# Patient Record
Sex: Male | Born: 1975 | Race: White | Hispanic: No | Marital: Single
Health system: Midwestern US, Community
[De-identification: ages and names within clinical notes are randomized; demographics above are authoritative.]

## PROBLEM LIST (undated history)

## (undated) DIAGNOSIS — R569 Unspecified convulsions: Secondary | ICD-10-CM

---

## 2018-05-30 ENCOUNTER — Other Ambulatory Visit: Payer: Self-pay

## 2018-05-30 ENCOUNTER — Emergency Department (HOSPITAL_BASED_OUTPATIENT_CLINIC_OR_DEPARTMENT_OTHER): Payer: BLUE CROSS/BLUE SHIELD

## 2018-05-30 ENCOUNTER — Ambulatory Visit: Payer: Self-pay | Admitting: Cardiology

## 2018-05-30 ENCOUNTER — Inpatient Hospital Stay (HOSPITAL_BASED_OUTPATIENT_CLINIC_OR_DEPARTMENT_OTHER)
Admission: EM | Admit: 2018-05-30 | Discharge: 2018-06-01 | DRG: 189 | Discharge status: Against Medical Advice | Payer: BLUE CROSS/BLUE SHIELD | Attending: Family Medicine | Admitting: Family Medicine

## 2018-05-30 ENCOUNTER — Encounter (HOSPITAL_BASED_OUTPATIENT_CLINIC_OR_DEPARTMENT_OTHER): Payer: Self-pay | Admitting: *Deleted

## 2018-05-30 DIAGNOSIS — R569 Unspecified convulsions: Secondary | ICD-10-CM | POA: Diagnosis present

## 2018-05-30 DIAGNOSIS — J9601 Acute respiratory failure with hypoxia: Secondary | ICD-10-CM | POA: Diagnosis not present

## 2018-05-30 DIAGNOSIS — G4733 Obstructive sleep apnea (adult) (pediatric): Secondary | ICD-10-CM | POA: Diagnosis present

## 2018-05-30 DIAGNOSIS — R55 Syncope and collapse: Secondary | ICD-10-CM | POA: Diagnosis not present

## 2018-05-30 DIAGNOSIS — F17210 Nicotine dependence, cigarettes, uncomplicated: Secondary | ICD-10-CM | POA: Diagnosis present

## 2018-05-30 DIAGNOSIS — J9602 Acute respiratory failure with hypercapnia: Secondary | ICD-10-CM | POA: Diagnosis present

## 2018-05-30 DIAGNOSIS — R4182 Altered mental status, unspecified: Secondary | ICD-10-CM

## 2018-05-30 DIAGNOSIS — R404 Transient alteration of awareness: Secondary | ICD-10-CM

## 2018-05-30 DIAGNOSIS — K6289 Other specified diseases of anus and rectum: Secondary | ICD-10-CM

## 2018-05-30 DIAGNOSIS — R4020 Unspecified coma: Secondary | ICD-10-CM | POA: Diagnosis present

## 2018-05-30 DIAGNOSIS — N492 Inflammatory disorders of scrotum: Secondary | ICD-10-CM | POA: Diagnosis present

## 2018-05-30 HISTORY — DX: Unspecified convulsions: R56.9

## 2018-05-30 LAB — CBC WITH DIFFERENTIAL/PLATELET
ABS IMMATURE GRANULOCYTES: 0.11 10*3/uL — AB (ref 0.00–0.07)
Basophils Absolute: 0.1 10*3/uL (ref 0.0–0.1)
Basophils Relative: 0 %
EOS ABS: 0 10*3/uL (ref 0.0–0.5)
Eosinophils Relative: 0 %
HEMATOCRIT: 45.1 % (ref 39.0–52.0)
Hemoglobin: 14.3 g/dL (ref 13.0–17.0)
IMMATURE GRANULOCYTES: 1 %
LYMPHS ABS: 1.9 10*3/uL (ref 0.7–4.0)
Lymphocytes Relative: 10 %
MCH: 29.7 pg (ref 26.0–34.0)
MCHC: 31.7 g/dL (ref 30.0–36.0)
MCV: 93.6 fL (ref 80.0–100.0)
Monocytes Absolute: 2 10*3/uL — ABNORMAL HIGH (ref 0.1–1.0)
Monocytes Relative: 10 %
NEUTROS ABS: 16.3 10*3/uL — AB (ref 1.7–7.7)
NEUTROS PCT: 79 %
NRBC: 0 % (ref 0.0–0.2)
PLATELETS: 294 10*3/uL (ref 150–400)
RBC: 4.82 MIL/uL (ref 4.22–5.81)
RDW: 13.5 % (ref 11.5–15.5)
WBC: 20.4 10*3/uL — ABNORMAL HIGH (ref 4.0–10.5)

## 2018-05-30 LAB — BASIC METABOLIC PANEL
ANION GAP: 7 (ref 5–15)
BUN: 15 mg/dL (ref 6–20)
CALCIUM: 8.8 mg/dL — AB (ref 8.9–10.3)
CHLORIDE: 103 mmol/L (ref 98–111)
CO2: 24 mmol/L (ref 22–32)
Creatinine, Ser: 1.01 mg/dL (ref 0.61–1.24)
GFR calc Af Amer: >60 mL/min (ref >60)
GFR calc non Af Amer: >60 mL/min (ref >60)
GLUCOSE: 93 mg/dL (ref 70–99)
Potassium: 3.9 mmol/L (ref 3.5–5.1)
Sodium: 134 mmol/L — ABNORMAL LOW (ref 135–145)

## 2018-05-30 LAB — CBC
HEMATOCRIT: 44.4 % (ref 39.0–52.0)
Hemoglobin: 14.5 g/dL (ref 13.0–17.0)
MCH: 30.5 pg (ref 26.0–34.0)
MCHC: 32.7 g/dL (ref 30.0–36.0)
MCV: 93.3 fL (ref 80.0–100.0)
PLATELETS: 276 10*3/uL (ref 150–400)
RBC: 4.76 MIL/uL (ref 4.22–5.81)
RDW: 13.3 % (ref 11.5–15.5)
WBC: 20.6 10*3/uL — ABNORMAL HIGH (ref 4.0–10.5)
nRBC: 0 % (ref 0.0–0.2)

## 2018-05-30 LAB — URINALYSIS, ROUTINE W REFLEX MICROSCOPIC
Bilirubin Urine: NEGATIVE
Glucose, UA: >=500 mg/dL — AB
Hgb urine dipstick: NEGATIVE
KETONES UR: NEGATIVE mg/dL
LEUKOCYTES UA: NEGATIVE
NITRITE: NEGATIVE
PROTEIN: NEGATIVE mg/dL
Specific Gravity, Urine: >1.03 — ABNORMAL HIGH (ref 1.005–1.030)
pH: 6 (ref 5.0–8.0)

## 2018-05-30 LAB — RAPID URINE DRUG SCREEN, HOSP PERFORMED
Amphetamines: NOT DETECTED
BARBITURATES: NOT DETECTED
BENZODIAZEPINES: NOT DETECTED
COCAINE: NOT DETECTED
OPIATES: NOT DETECTED
Tetrahydrocannabinol: POSITIVE — AB

## 2018-05-30 LAB — I-STAT VENOUS BLOOD GAS, ED
BICARBONATE: 27.6 mmol/L (ref 20.0–28.0)
O2 Saturation: 76 %
PH VEN: 7.316 (ref 7.250–7.430)
TCO2: 29 mmol/L (ref 22–32)
pCO2, Ven: 53.7 mmHg (ref 44.0–60.0)
pO2, Ven: 44 mmHg (ref 32.0–45.0)

## 2018-05-30 LAB — D-DIMER, QUANTITATIVE: D-Dimer, Quant: <0.27 ug/mL-FEU (ref 0.00–0.50)

## 2018-05-30 LAB — URINALYSIS, MICROSCOPIC (REFLEX): WBC UA: NONE SEEN WBC/hpf (ref 0–5)

## 2018-05-30 LAB — CBG MONITORING, ED: GLUCOSE-CAPILLARY: 92 mg/dL (ref 70–99)

## 2018-05-30 LAB — I-STAT CG4 LACTIC ACID, ED: LACTIC ACID, VENOUS: 0.91 mmol/L (ref 0.5–1.9)

## 2018-05-30 LAB — ETHANOL: Alcohol, Ethyl (B): <10 mg/dL (ref <10)

## 2018-05-30 MED ORDER — ONDANSETRON HCL 4 MG/2ML IJ SOLN
INTRAMUSCULAR | Status: AC
  Filled 2018-05-30: qty 2

## 2018-05-30 MED ORDER — HALOPERIDOL LACTATE 5 MG/ML IJ SOLN
5.0000 mg | Freq: Once | INTRAMUSCULAR | Status: AC
  Administered 2018-05-30: 5 mg via INTRAVENOUS
  Filled 2018-05-30: qty 1

## 2018-05-30 MED ORDER — SODIUM CHLORIDE 0.9 % IV BOLUS
1000.0000 mL | Freq: Once | INTRAVENOUS | Status: AC
  Administered 2018-05-30: 1000 mL via INTRAVENOUS

## 2018-05-30 MED ORDER — CLINDAMYCIN PHOSPHATE 600 MG/50ML IV SOLN
600.0000 mg | Freq: Once | INTRAVENOUS | Status: AC
  Administered 2018-05-30: 600 mg via INTRAVENOUS
  Filled 2018-05-30: qty 50

## 2018-05-30 MED ORDER — NICOTINE 21 MG/24HR TD PT24
21.0000 mg | MEDICATED_PATCH | Freq: Once | TRANSDERMAL | Status: DC
  Administered 2018-05-30: 21 mg via TRANSDERMAL
  Filled 2018-05-30: qty 1

## 2018-05-30 MED ORDER — ONDANSETRON HCL 4 MG/2ML IJ SOLN
4.0000 mg | Freq: Once | INTRAMUSCULAR | Status: AC
  Administered 2018-05-30: 4 mg via INTRAVENOUS

## 2018-05-30 NOTE — ED Notes (Signed)
Pt taken to radiology

## 2018-05-30 NOTE — ED Triage Notes (Signed)
Family member reports pt was found unresponsive at the [facility] on [address]. EMS evaluation on scene. POV transport to ED by family. Pt states he remembers pulling up to the gas pump and walking into the store. Pt has a history of seizures and has had hx of irregular heartbeat which he has an appt to see cardiology. Pt's wife states he was in his car for an unknown length of time before EMS was called. Pt states he last had a seizure in 2009. C/o difficulty hearing. Pt unclear of events. Alert at this time. Pt's wife reports he has vomited at least 2-3 times. Unknown if pt fell. CBG 385 by EMS pta

## 2018-05-30 NOTE — Progress Notes (Signed)
Patient's SPCO 0.9 on Pulse CO - Oximeter.

## 2018-05-30 NOTE — Progress Notes (Signed)
Patient's SPCO 0.9 on Pulse CO - Oximeter.  Dr Clarene DukeLittle was in the room and is aware of result.

## 2018-05-30 NOTE — ED Triage Notes (Signed)
Pt in BR with family member at this time

## 2018-05-30 NOTE — ED Notes (Signed)
US at bedside

## 2018-05-30 NOTE — ED Notes (Signed)
Per Pts wife.  Pt driving home from [place] today. States that he stopped at a gas station, went inside to pay(unsure if he really went in).  States that he sat in his car 'unresponsive' for 1-1.5 hours before clerk called 911.  Pt unresponsive on EMS arrival.  CBG 358 by EMS. Pt refused transport to hospital.  States that he has had multiple episode of syncope over the last few months. Wore a halter monitor and was supposed to get the results today but was unable to make it to his appointment. Pt reports 'irregular' HR.  Also reports drinking 'multiple Monster' energy drinks daily.  Pt denies ETOH and drug abuse.  Pt very lethargic on assessment.  Shallow breathing. Reports that he has an abscess in his groin that is draining and has been there for 'months'

## 2018-05-30 NOTE — ED Notes (Signed)
Pt getting dressed, attempting to leave.  EDP aware.  At bedside to talk with patient.  Pt provided with a caffeine free coke.  No acute distress noted.

## 2018-05-30 NOTE — ED Notes (Signed)
Pt unable to give a urine sample at this time.

## 2018-05-30 NOTE — Progress Notes (Signed)
Placed patient on end tidal CO2 monitoring and 4 liters of of O2 due to brief periods of apnea and O2 desaturation.  Notifed Dr Clarene DukeLittle and she is aware.  RT will continue to monitor.

## 2018-05-31 ENCOUNTER — Inpatient Hospital Stay (HOSPITAL_COMMUNITY): Payer: BLUE CROSS/BLUE SHIELD

## 2018-05-31 ENCOUNTER — Encounter (HOSPITAL_COMMUNITY): Payer: Self-pay | Admitting: *Deleted

## 2018-05-31 DIAGNOSIS — J9602 Acute respiratory failure with hypercapnia: Secondary | ICD-10-CM

## 2018-05-31 DIAGNOSIS — N492 Inflammatory disorders of scrotum: Secondary | ICD-10-CM

## 2018-05-31 DIAGNOSIS — R55 Syncope and collapse: Secondary | ICD-10-CM

## 2018-05-31 DIAGNOSIS — R569 Unspecified convulsions: Secondary | ICD-10-CM | POA: Diagnosis present

## 2018-05-31 DIAGNOSIS — J9601 Acute respiratory failure with hypoxia: Principal | ICD-10-CM

## 2018-05-31 DIAGNOSIS — R4182 Altered mental status, unspecified: Secondary | ICD-10-CM

## 2018-05-31 DIAGNOSIS — F17210 Nicotine dependence, cigarettes, uncomplicated: Secondary | ICD-10-CM | POA: Diagnosis present

## 2018-05-31 DIAGNOSIS — G4733 Obstructive sleep apnea (adult) (pediatric): Secondary | ICD-10-CM | POA: Diagnosis present

## 2018-05-31 DIAGNOSIS — R4020 Unspecified coma: Secondary | ICD-10-CM | POA: Diagnosis present

## 2018-05-31 LAB — I-STAT VENOUS BLOOD GAS, ED
Acid-Base Excess: 3 mmol/L — ABNORMAL HIGH (ref 0.0–2.0)
Bicarbonate: 31 mmol/L — ABNORMAL HIGH (ref 20.0–28.0)
O2 SAT: 93 %
PCO2 VEN: 63.8 mmHg — AB (ref 44.0–60.0)
PH VEN: 7.295 (ref 7.250–7.430)
TCO2: 33 mmol/L — ABNORMAL HIGH (ref 22–32)
pO2, Ven: 78 mmHg — ABNORMAL HIGH (ref 32.0–45.0)

## 2018-05-31 LAB — TSH: TSH: 3.306 u[IU]/mL (ref 0.350–4.500)

## 2018-05-31 LAB — BLOOD GAS, ARTERIAL
Acid-Base Excess: 2.9 mmol/L — ABNORMAL HIGH (ref 0.0–2.0)
Bicarbonate: 27.8 mmol/L (ref 20.0–28.0)
Drawn by: 313941
FIO2: 21
O2 Saturation: 94.2 %
PH ART: 7.366 (ref 7.350–7.450)
Patient temperature: 98.3
pCO2 arterial: 49.7 mmHg — ABNORMAL HIGH (ref 32.0–48.0)
pO2, Arterial: 69.9 mmHg — ABNORMAL LOW (ref 83.0–108.0)

## 2018-05-31 LAB — CBC
HCT: 40.1 % (ref 39.0–52.0)
Hemoglobin: 12.6 g/dL — ABNORMAL LOW (ref 13.0–17.0)
MCH: 29.4 pg (ref 26.0–34.0)
MCHC: 31.4 g/dL (ref 30.0–36.0)
MCV: 93.5 fL (ref 80.0–100.0)
Platelets: 239 10*3/uL (ref 150–400)
RBC: 4.29 MIL/uL (ref 4.22–5.81)
RDW: 13.2 % (ref 11.5–15.5)
WBC: 9.9 10*3/uL (ref 4.0–10.5)
nRBC: 0 % (ref 0.0–0.2)

## 2018-05-31 LAB — HEPATIC FUNCTION PANEL
ALT: 20 U/L (ref 0–44)
AST: 17 U/L (ref 15–41)
Albumin: 3.6 g/dL (ref 3.5–5.0)
Alkaline Phosphatase: 53 U/L (ref 38–126)
Bilirubin, Direct: <0.1 mg/dL (ref 0.0–0.2)
Total Bilirubin: 0.6 mg/dL (ref 0.3–1.2)
Total Protein: 6 g/dL — ABNORMAL LOW (ref 6.5–8.1)

## 2018-05-31 LAB — CBC WITH DIFFERENTIAL/PLATELET
Abs Immature Granulocytes: 0.04 10*3/uL (ref 0.00–0.07)
Basophils Absolute: 0.1 10*3/uL (ref 0.0–0.1)
Basophils Relative: 1 %
Eosinophils Absolute: 0.2 10*3/uL (ref 0.0–0.5)
Eosinophils Relative: 2 %
HCT: 38.8 % — ABNORMAL LOW (ref 39.0–52.0)
Hemoglobin: 12.6 g/dL — ABNORMAL LOW (ref 13.0–17.0)
Immature Granulocytes: 0 %
Lymphocytes Relative: 32 %
Lymphs Abs: 3.4 10*3/uL (ref 0.7–4.0)
MCH: 30.2 pg (ref 26.0–34.0)
MCHC: 32.5 g/dL (ref 30.0–36.0)
MCV: 93 fL (ref 80.0–100.0)
Monocytes Absolute: 1.3 10*3/uL — ABNORMAL HIGH (ref 0.1–1.0)
Monocytes Relative: 12 %
NEUTROS PCT: 53 %
NRBC: 0 % (ref 0.0–0.2)
Neutro Abs: 5.8 10*3/uL (ref 1.7–7.7)
Platelets: 218 10*3/uL (ref 150–400)
RBC: 4.17 MIL/uL — AB (ref 4.22–5.81)
RDW: 13.3 % (ref 11.5–15.5)
WBC: 10.8 10*3/uL — ABNORMAL HIGH (ref 4.0–10.5)

## 2018-05-31 LAB — BASIC METABOLIC PANEL
Anion gap: 7 (ref 5–15)
BUN: 10 mg/dL (ref 6–20)
CO2: 28 mmol/L (ref 22–32)
Calcium: 8.4 mg/dL — ABNORMAL LOW (ref 8.9–10.3)
Chloride: 102 mmol/L (ref 98–111)
Creatinine, Ser: 0.99 mg/dL (ref 0.61–1.24)
GFR calc Af Amer: >60 mL/min (ref >60)
GFR calc non Af Amer: >60 mL/min (ref >60)
Glucose, Bld: 75 mg/dL (ref 70–99)
Potassium: 3.8 mmol/L (ref 3.5–5.1)
Sodium: 137 mmol/L (ref 135–145)

## 2018-05-31 LAB — CK: Total CK: 90 U/L (ref 49–397)

## 2018-05-31 LAB — MAGNESIUM: Magnesium: 1.9 mg/dL (ref 1.7–2.4)

## 2018-05-31 LAB — TROPONIN I
Troponin I: 0.04 ng/mL (ref <0.03)
Troponin I: <0.03 ng/mL (ref <0.03)
Troponin I: <0.03 ng/mL (ref <0.03)

## 2018-05-31 LAB — MRSA PCR SCREENING: MRSA BY PCR: NEGATIVE

## 2018-05-31 LAB — SALICYLATE LEVEL: Salicylate Lvl: <7 mg/dL (ref 2.8–30.0)

## 2018-05-31 MED ORDER — PIPERACILLIN-TAZOBACTAM 3.375 G IVPB
3.3750 g | Freq: Three times a day (TID) | INTRAVENOUS | Status: DC
  Filled 2018-05-31 (×2): qty 50

## 2018-05-31 MED ORDER — ENOXAPARIN SODIUM 40 MG/0.4ML ~~LOC~~ SOLN: 40.0000 mg | SUBCUTANEOUS | Status: DC

## 2018-05-31 MED ORDER — IOHEXOL 300 MG/ML  SOLN
100.0000 mL | Freq: Once | INTRAMUSCULAR | Status: AC | PRN
  Administered 2018-05-31: 100 mL via INTRAVENOUS

## 2018-05-31 MED ORDER — CLINDAMYCIN HCL 300 MG PO CAPS
300.0000 mg | ORAL_CAPSULE | Freq: Three times a day (TID) | ORAL | Status: DC
  Administered 2018-05-31 – 2018-06-01 (×3): 300 mg via ORAL
  Filled 2018-05-31 (×4): qty 1

## 2018-05-31 MED ORDER — NICOTINE 21 MG/24HR TD PT24
21.0000 mg | MEDICATED_PATCH | Freq: Once | TRANSDERMAL | Status: AC
  Administered 2018-05-31: 21 mg via TRANSDERMAL
  Filled 2018-05-31: qty 1

## 2018-05-31 MED ORDER — NICOTINE 21 MG/24HR TD PT24
21.0000 mg | MEDICATED_PATCH | Freq: Every day | TRANSDERMAL | Status: DC
  Filled 2018-05-31: qty 1

## 2018-05-31 MED ORDER — ACETAMINOPHEN 325 MG PO TABS
650.0000 mg | ORAL_TABLET | Freq: Four times a day (QID) | ORAL | Status: DC | PRN
  Administered 2018-05-31 (×2): 650 mg via ORAL
  Filled 2018-05-31 (×2): qty 2

## 2018-05-31 MED ORDER — SODIUM CHLORIDE 0.9 % IV SOLN
INTRAVENOUS | Status: AC
  Administered 2018-05-31: 18:00:00 via INTRAVENOUS

## 2018-05-31 MED ORDER — ACETAMINOPHEN 650 MG RE SUPP: 650.0000 mg | Freq: Four times a day (QID) | RECTAL | Status: DC | PRN

## 2018-05-31 MED ORDER — ONDANSETRON HCL 4 MG/2ML IJ SOLN: 4.0000 mg | Freq: Four times a day (QID) | INTRAMUSCULAR | Status: DC | PRN

## 2018-05-31 MED ORDER — ONDANSETRON HCL 4 MG PO TABS: 4.0000 mg | ORAL_TABLET | Freq: Four times a day (QID) | ORAL | Status: DC | PRN

## 2018-05-31 NOTE — Progress Notes (Signed)
EEG completed; results pending.    

## 2018-05-31 NOTE — ED Notes (Signed)
Lawson FiscalLori: [phone]: [phone]

## 2018-05-31 NOTE — H&P (Signed)
History and Physical    Ricardo Blair ZOX:[id number] DOB: 30-Mar-1976 DOA: 05/30/2018  PCP: Patient, No Pcp Per  Patient coming from: Home.  Chief Complaint: Loss of consciousness.  HPI: Ricardo Blair is a 42 y.o. male with previous remote history of seizures was found to be unresponsive in his car at the gas station and was brought to the ER admits in [facility]  Patient states he had gone to the gas station last evening and the next thing he remembers that the EMS was trying to wake him up.  Denies any headache chest pain shortness of breath nausea vomiting abdominal pain diarrhea.  Admits to smoking marijuana off and on.  Last 1 was many weeks ago.  Patient states of recently he has been having frequent palpitations and has been scheduled to follow-up with cardiologist in [facility] which he missed his appointment last month.  Also has been having scrotal swelling around the perineal area has been left for last few months.  Has not sought any medical attention.  Denies any discharge from the penis.  ED Course: In the ER labs show leukocytosis EKG shows normal sinus rhythm with QRS of 96 ms and acceptable QTc interval.  D-dimer was negative.  CT head is unremarkable.  ER physician discussed with the urologist about the small scrotal abscess seen in the sonogram of the scrotum with urologist who advised to keep patient on antibiotics and reconsult them if required and also discussed the neurologist about the patient's unresponsive episode with previous history of seizures and neurologist recommended MRI brain and EEG.  While in the ER patient was drowsy and hypoxic for which patient had VBG done which showed mild hypercarbia and was placed on BiPAP.  On my exam patient is mildly drowsy but follows commands.  Urine drug screen is positive for marijuana.  Patient states he does take salicylates every day.  Review of Systems: As per HPI, rest all negative.   Past Medical History:  Diagnosis Date  .  Seizures (HCC)     History reviewed. No pertinent surgical history.   reports that he has been smoking cigarettes. He has quit using smokeless tobacco. He reports that he drinks alcohol. He reports that he has current or past drug history. Drug: Marijuana.  No Known Allergies  Family History  Family history unknown: Yes    Prior to Admission medications   Not on File    Physical Exam: Vitals:   05/31/18 0038 05/31/18 0200 05/31/18 0342 05/31/18 0400  BP:  133/69 126/71   Pulse:  69 80 65  Resp: 10 11 11  (!) 6  Temp:   98.3 F (36.8 C)   TempSrc:   Oral   SpO2: 100% 100% 100% 100%  Weight:   80.4 kg   Height:   6\' 3"  (1.905 m)       Constitutional: Moderately built and nourished. Vitals:   05/31/18 0038 05/31/18 0200 05/31/18 0342 05/31/18 0400  BP:  133/69 126/71   Pulse:  69 80 65  Resp: 10 11 11  (!) 6  Temp:   98.3 F (36.8 C)   TempSrc:   Oral   SpO2: 100% 100% 100% 100%  Weight:   80.4 kg   Height:   6\' 3"  (1.905 m)    Eyes: Anicteric no pallor. ENMT: No discharge from the ears eyes nose or mouth. Neck: No mass felt.  No neck rigidity. Respiratory: No rhonchi or crepitations. Cardiovascular: S1-S2 heard no murmurs appreciated. Abdomen:  Soft nontender bowel sounds present. Musculoskeletal: No edema.  No joint effusion. Skin: No rash. Neurologic: Alert awake mildly drowsy oriented to time place and person moves all extremities.  Pupils equal and reacting to light. Psychiatric: Denies any suicidal ideation.   Labs on Admission: I have personally reviewed following labs and imaging studies  CBC: Recent Labs  Lab 05/30/18 1909  WBC 20.4*  20.6*  NEUTROABS 16.3*  HGB 14.3  14.5  HCT 45.1  44.4  MCV 93.6  93.3  PLT 294  276   Basic Metabolic Panel: Recent Labs  Lab 05/30/18 1909  NA 134*  K 3.9  CL 103  CO2 24  GLUCOSE 93  BUN 15  CREATININE 1.01  CALCIUM 8.8*   GFR: Estimated Creatinine Clearance: 109.5 mL/min (by C-G formula  based on SCr of 1.01 mg/dL). Liver Function Tests: No results for input(s): AST, ALT, ALKPHOS, BILITOT, PROT, ALBUMIN in the last 168 hours. No results for input(s): LIPASE, AMYLASE in the last 168 hours. No results for input(s): AMMONIA in the last 168 hours. Coagulation Profile: No results for input(s): INR, PROTIME in the last 168 hours. Cardiac Enzymes: No results for input(s): CKTOTAL, CKMB, CKMBINDEX, TROPONINI in the last 168 hours. BNP (last 3 results) No results for input(s): PROBNP in the last 8760 hours. HbA1C: No results for input(s): HGBA1C in the last 72 hours. CBG: Recent Labs  Lab 05/30/18 1855  GLUCAP 92   Lipid Profile: No results for input(s): CHOL, HDL, LDLCALC, TRIG, CHOLHDL, LDLDIRECT in the last 72 hours. Thyroid Function Tests: No results for input(s): TSH, T4TOTAL, FREET4, T3FREE, THYROIDAB in the last 72 hours. Anemia Panel: No results for input(s): VITAMINB12, FOLATE, FERRITIN, TIBC, IRON, RETICCTPCT in the last 72 hours. Urine analysis:    Component Value Date/Time   COLORURINE YELLOW 05/30/2018 1910   APPEARANCEUR CLEAR 05/30/2018 1910   LABSPEC >1.030 (H) 05/30/2018 1910   PHURINE 6.0 05/30/2018 1910   GLUCOSEU >=500 (A) 05/30/2018 1910   HGBUR NEGATIVE 05/30/2018 1910   BILIRUBINUR NEGATIVE 05/30/2018 1910   KETONESUR NEGATIVE 05/30/2018 1910   PROTEINUR NEGATIVE 05/30/2018 1910   NITRITE NEGATIVE 05/30/2018 1910   LEUKOCYTESUR NEGATIVE 05/30/2018 1910   Sepsis Labs: @LABRCNTIP (procalcitonin:4,lacticidven:4) )No results found for this or any previous visit (from the past 240 hour(s)).   Radiological Exams on Admission: Dg Chest 2 View  Result Date: 05/30/2018 CLINICAL DATA:  Leukocytosis, altered level of consciousness, found unresponsive earlier today, history of seizures EXAM: CHEST - 2 VIEW COMPARISON:  None FINDINGS: Normal heart size, mediastinal contours, and pulmonary vascularity. Lungs clear. No pulmonary infiltrate, pleural  effusion, or pneumothorax. Question LEFT nipple shadow. No acute osseous findings. IMPRESSION: No acute abnormalities. Question LEFT nipple shadow; repeat PA chest radiograph with nipple markers recommended to exclude pulmonary nodule. Electronically Signed   By: Ulyses SouthwardMark  Boles M.D.   On: 05/30/2018 20:04   Ct Head Wo Contrast  Result Date: 05/30/2018 CLINICAL DATA:  Altered level of consciousness, history of seizures EXAM: CT HEAD WITHOUT CONTRAST TECHNIQUE: Contiguous axial images were obtained from the base of the skull through the vertex without intravenous contrast. COMPARISON:  None FINDINGS: Brain: Normal ventricular morphology. No midline shift or mass effect. Normal appearance of brain parenchyma. No intracranial hemorrhage, mass lesion, or evidence of acute infarction. No extra-axial fluid collections. Vascular: No hyperdense vessels. Skull: Intact Sinuses/Orbits: Mucosal thickening throughout ethmoid air cells bilaterally. Remaining visualized paranasal sinuses and mastoid air cells clear Other: N/A IMPRESSION: No acute intracranial abnormalities. Electronically Signed  By: Ulyses Southward M.D.   On: 05/30/2018 20:01   US Pelvis Limited (transabdominal Only)  Result Date: 05/30/2018 CLINICAL DATA:  Mass on perineum that drains fluid EXAM: LIMITED ULTRASOUND OF PELVIS TECHNIQUE: Limited transabdominal ultrasound examination of the pelvis was performed. COMPARISON:  None. FINDINGS: Targeted ultrasound in the region of concern. Echogenic edema within the soft tissues with soft tissue thickening present. Small complex collection with thickened vascular rim, this measures 1.5 x 0.5 x 1.6 cm. IMPRESSION: 1.5 x 0.5 x 1.6 cm thick-walled collection within the perineum corresponding to the region of concern. This could reflect abscess or inflammatory fluid collection. There is edema and soft tissue thickening of the surrounding soft tissues. Electronically Signed   By: Jasmine Pang M.D.   On: 05/30/2018 21:28    Dg Chest Port 1 View  Result Date: 05/30/2018 CLINICAL DATA:  Altered level of consciousness. Repeat exam with nipple marker EXAM: PORTABLE CHEST 1 VIEW COMPARISON:  Frontal and lateral views earlier this day. FINDINGS: AP semi upright view of the chest obtained with nipple markers. Prior nodular density at the left lung base is not seen. Nipple markers project over the diaphragms. The lungs are otherwise clear, no other interval change. IMPRESSION: Previous nodular density at the left base is no longer seen, likely represented nipple shadow. Electronically Signed   By: Narda Rutherford M.D.   On: 05/30/2018 22:18    EKG: Independently reviewed.  Normal sinus rhythm.  Assessment/Plan Principal Problem:   Acute respiratory failure with hypoxia and hypercapnia (HCC) Active Problems:   Scrotal abscess   Syncope    1. Acute respiratory failure with hypoxia and hypercarbia -patient was placed on BiPAP.  Will check ABG.  And based on which we will plan weaning off BiPAP.  Chest x-ray does not show any infiltrates d-dimer negative.  On exam patient does not have any wheezing.  Check salicylate levels. 2. Scrotal abscess for which patient is placed on empiric antibiotics.  Follow cultures.  May consult urology in a.m. 3. Possible syncope versus unresponsive episode for which we will check MRI brain EEG to rule out any seizure-like activities and also follow-up ABG to make sure there is no hypercarbia persistent and check 2D echo cardiac markers. 4. Tobacco abuse -cessation counseling requested. 5. Marijuana abuse.   DVT prophylaxis: Lovenox. Code Status: Full code. Family Communication: Discussed with patient. Disposition Plan: Home. Consults called: None. Admission status: Observation.   Eduard Clos MD [facility] Pager [phone].  If 7PM-7AM, please contact night-coverage [email or web address] Password [facility]  05/31/2018, 5:27 AM

## 2018-05-31 NOTE — Progress Notes (Signed)
Pharmacy Antibiotic Note  Ricardo DingwallRobert Blair is a 42 y.o. male admitted on 05/30/2018 with intra abdominal infection.  Pharmacy has been consulted for zosyn dosing.  Plan: Zosyn 3.375g IV q8h (4 hour infusion).  Height: 6\' 3"  (190.5 cm) Weight: 177 lb 4 oz (80.4 kg) IBW/kg (Calculated) : 84.5  Temp (24hrs), Avg:98.2 F (36.8 C), Min:98 F (36.7 C), Max:98.3 F (36.8 C)  Recent Labs  Lab 05/30/18 1909 05/30/18 1927  WBC 20.4*  20.6*  --   CREATININE 1.01  --   LATICACIDVEN  --  0.91    Estimated Creatinine Clearance: 109.5 mL/min (by C-G formula based on SCr of 1.01 mg/dL).    No Known Allergies    Thank you for allowing pharmacy to be a part of this patient's care.  Ricardo Blair, Ricardo Blair 05/31/2018 5:30 AM

## 2018-05-31 NOTE — Consult Note (Signed)
Urology Consult Note   Requesting Attending Physician:  Eduard ClosKakrakandy, Arshad N, MD Service Providing Consult: Urology  Consulting Attending: Crecencio McLes Borden, MD   Reason for Consult: Possible scrotal abscess  HPI: Ricardo Blair is seen in consultation for reasons noted above at the request of Eduard ClosKakrakandy, Arshad N, MD for evaluation of possible scrotal abscess.  This is a 42 y.o. male who presented to the ED with altered mental status after he was found down. He underwent work-up by the ED and is thought to have possibly had a seizure. [facility] and [facility].   Urology is consulted for a possible perineal abscess. The patient reports an area in the perineum that has intermittently become swollen and subsequently drained fluid. It started this past summer in July and has since flared up a few times. He recently had some drainage from this area. Denies fevers, chills. In the ED yesterday, it drained clear fluid after he barely touched it. Denies ongoing pain or tenderness.   Previous boil on hip, but no history of scrotal or perineal abscesses.   US in the ED demonstrated a 1.6 cm thick-walled collection with surrounding soft tissue edema and thickening within the perineum which could be an abscess or inflammatory fluid collection.  The patient does have a leukocytosis of 20 of unknown etiology.  He received a dose of IV clindamycin in the ED.   Past Medical History: Past Medical History:  Diagnosis Date  . Seizures (HCC)     Past Surgical History:  History reviewed. No pertinent surgical history.  Medication: Current Facility-Administered Medications  Medication Dose Route Frequency Provider Last Rate Last Dose  . nicotine (NICODERM CQ - dosed in mg/24 hours) patch 21 mg  21 mg Transdermal Once Little, Ambrose Finlandachel Morgan, MD   21 mg at 05/30/18 2149   No current outpatient medications on file.    Allergies: No Known Allergies  Social History: Social History    Tobacco Use  . Smoking status: Current Every Day Smoker    Types: Cigarettes  . Smokeless tobacco: Former Engineer, waterUser  Substance Use Topics  . Alcohol use: Yes    Comment: 3x month  . Drug use: Not Currently    Types: Marijuana    Family History No family history on file.  Review of Systems 10 systems were reviewed and are negative except as noted specifically in the HPI.  Objective   Vital signs in last 24 hours: BP 133/69   Pulse 69   Temp 98 F (36.7 C) (Oral)   Resp 11   Ht 6\' 3"  (1.905 m)   Wt 79.8 kg   SpO2 100%   BMI 22.00 kg/m   Physical Exam General: NAD, A&O, resting, appropriate HEENT: [place]/AT, EOMI, MMM Pulmonary: Normal work of breathing Cardiovascular: HDS, adequate peripheral perfusion Abdomen: Soft, NTTP, nondistended. GU: circumcised phallus with patent urethral meatus. Testicles descended bilaterally and symmetric in size. No testicular masses or tenderness. Likely left varicocele. 0.5 x 1 cm palpable nodule on the left perineum, no fluctuance, firm in consistency. No drainage with manipulation. Small amount of overlying skin erythema.  DRE: moderately enlarged prostate without palpable nodules, no evidence of peri-rectal abscess, fluid collections, or tenderness.  Extremities: warm and well perfused Neuro: Appropriate, no focal neurological deficits  Most Recent Labs: Lab Results  Component Value Date   WBC 20.6 (H) 05/30/2018   WBC 20.4 (H) 05/30/2018   HGB 14.5 05/30/2018   HGB 14.3 05/30/2018  HCT 44.4 05/30/2018   HCT 45.1 05/30/2018   PLT 276 05/30/2018   PLT 294 05/30/2018    Lab Results  Component Value Date   NA 134 (L) 05/30/2018   K 3.9 05/30/2018   CL 103 05/30/2018   CO2 24 05/30/2018   BUN 15 05/30/2018   CREATININE 1.01 05/30/2018   CALCIUM 8.8 (L) 05/30/2018    No results found for: INR, APTT   IMAGING: Dg Chest 2 View  Result Date: 05/30/2018 CLINICAL DATA:  Leukocytosis, altered level of consciousness, found  unresponsive earlier today, history of seizures EXAM: CHEST - 2 VIEW COMPARISON:  None FINDINGS: Normal heart size, mediastinal contours, and pulmonary vascularity. Lungs clear. No pulmonary infiltrate, pleural effusion, or pneumothorax. Question LEFT nipple shadow. No acute osseous findings. IMPRESSION: No acute abnormalities. Question LEFT nipple shadow; repeat PA chest radiograph with nipple markers recommended to exclude pulmonary nodule. Electronically Signed   By: Ulyses Southward M.D.   On: 05/30/2018 20:04   Ct Head Wo Contrast  Result Date: 05/30/2018 CLINICAL DATA:  Altered level of consciousness, history of seizures EXAM: CT HEAD WITHOUT CONTRAST TECHNIQUE: Contiguous axial images were obtained from the base of the skull through the vertex without intravenous contrast. COMPARISON:  None FINDINGS: Brain: Normal ventricular morphology. No midline shift or mass effect. Normal appearance of brain parenchyma. No intracranial hemorrhage, mass lesion, or evidence of acute infarction. No extra-axial fluid collections. Vascular: No hyperdense vessels. Skull: Intact Sinuses/Orbits: Mucosal thickening throughout ethmoid air cells bilaterally. Remaining visualized paranasal sinuses and mastoid air cells clear Other: N/A IMPRESSION: No acute intracranial abnormalities. Electronically Signed   By: Ulyses Southward M.D.   On: 05/30/2018 20:01   US Pelvis Limited (transabdominal Only)  Result Date: 05/30/2018 CLINICAL DATA:  Mass on perineum that drains fluid EXAM: LIMITED ULTRASOUND OF PELVIS TECHNIQUE: Limited transabdominal ultrasound examination of the pelvis was performed. COMPARISON:  None. FINDINGS: Targeted ultrasound in the region of concern. Echogenic edema within the soft tissues with soft tissue thickening present. Small complex collection with thickened vascular rim, this measures 1.5 x 0.5 x 1.6 cm. IMPRESSION: 1.5 x 0.5 x 1.6 cm thick-walled collection within the perineum corresponding to the region of  concern. This could reflect abscess or inflammatory fluid collection. There is edema and soft tissue thickening of the surrounding soft tissues. Electronically Signed   By: Jasmine Pang M.D.   On: 05/30/2018 21:28   Dg Chest Port 1 View  Result Date: 05/30/2018 CLINICAL DATA:  Altered level of consciousness. Repeat exam with nipple marker EXAM: PORTABLE CHEST 1 VIEW COMPARISON:  Frontal and lateral views earlier this day. FINDINGS: AP semi upright view of the chest obtained with nipple markers. Prior nodular density at the left lung base is not seen. Nipple markers project over the diaphragms. The lungs are otherwise clear, no other interval change. IMPRESSION: Previous nodular density at the left base is no longer seen, likely represented nipple shadow. Electronically Signed   By: Narda Rutherford M.D.   On: 05/30/2018 22:18    ------  Assessment:  42 y.o. male admitted for seizure / syncope work-up who has a possible perineal abscess vs fluid collection. On examination, the patient has no tenderness but does have some overlying skin erythema. Patient is afebrile and HDS. The exam is relatively unimpressive, but he does have a leukocytosis of 20. Given the discrepancy between the patient's examination and leukocytosis, recommend further imaging of his pelvis to evaluate for possible infection not appreciated on examination.  Recommendations: - CT pelvis w/contrast ordered to further evaluate pelvis. - Continue clindamycin pending work-up. - Urology will follow-up CT.   Thank you for this consult. Please contact the urology consult pager with any further questions/concerns.
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
## 2018-05-31 NOTE — Procedures (Signed)
History: 42 year old male being evaluated for loss of consciousness  Sedation: None  Technique: This is a 21 channel routine scalp EEG performed at the bedside with bipolar and monopolar montages arranged in accordance to the international 10/20 system of electrode placement. One channel was dedicated to EKG recording.    Background: The background consists of intermixed alpha and beta activities. There is a well defined posterior dominant rhythm of 8 hz that attenuates with eye opening. Sleep is recorded with normal appearing structures.   Photic stimulation: Physiologic driving is present  EEG Abnormalities: None  Clinical Interpretation: This normal EEG is recorded in the waking and sleep state. There was no seizure or seizure predisposition recorded on this study. Please note that lack of epileptiform activity on EEG does not preclude the possibility of epilepsy.   Ritta SlotMcNeill Adrienne Trombetta, MD [facility] [phone]  If 7pm- 7am, please page neurology on call as listed in [facility].

## 2018-05-31 NOTE — Progress Notes (Signed)
Offered to place pt on BiPAP for the night pt declined. Pt stated he does not feel like he needs tonight. RT will continue to monitor.
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
## 2018-06-01 ENCOUNTER — Other Ambulatory Visit (HOSPITAL_COMMUNITY): Payer: Self-pay

## 2018-06-01 LAB — HIV ANTIBODY (ROUTINE TESTING W REFLEX): HIV Screen 4th Generation wRfx: NONREACTIVE

## 2018-06-01 MED ORDER — CLINDAMYCIN HCL 300 MG PO CAPS: 300.0000 mg | ORAL_CAPSULE | Freq: Three times a day (TID) | ORAL | 0 refills | Status: DC

## 2018-06-01 NOTE — Progress Notes (Signed)
RN rounding on pts. Pt and wife not in room appears pt has left AMA, IV still intact. MD made aware.

## 2018-06-01 NOTE — Final Consult Note (Signed)
Subjective: Patient reports no perineal pain. Wants to go home  Objective: Vital signs in last 24 hours: Temp:  [97.7 F (36.5 C)-98.7 F (37.1 C)] 98.2 F (36.8 C) (11/29 0740) Pulse Rate:  [70-88] 88 (11/29 0740) Resp:  [9-20] 13 (11/29 0740) BP: (122-139)/(63-81) 125/68 (11/29 0740) SpO2:  [95 %-99 %] 99 % (11/29 0300)  Intake/Output from previous day: 11/28 0701 - 11/29 0700 In: 935.1 [P.O.:360; I.V.:575.1] Out: 0  Intake/Output this shift: Total I/O In: 240 [P.O.:240] Out: 0   Physical Exam:  Constitutional: Vital signs reviewed. WD WN in NAD   Eyes: PERRL, No scleral icterus.   Cardiovascular: RRR Pulmonary/Chest: Normal effort.  Genitourinary: Minimal perineal induration/fibrosis. No active process. Extremities: No cyanosis or edema   Lab Results: Recent Labs    05/30/18 1909 05/31/18 0531 05/31/18 1820  HGB 14.3  14.5 12.6* 12.6*  HCT 45.1  44.4 38.8* 40.1   BMET Recent Labs    05/30/18 1909 05/31/18 0531  NA 134* 137  K 3.9 3.8  CL 103 102  CO2 24 28  GLUCOSE 93 75  BUN 15 10  CREATININE 1.01 0.99  CALCIUM 8.8* 8.4*   No results for input(s): LABPT, INR in the last 72 hours. No results for input(s): LABURIN in the last 72 hours. Results for orders placed or performed during the hospital encounter of 05/30/18  MRSA PCR Screening     Status: None   Collection Time: 05/31/18  3:57 AM  Result Value Ref Range Status   MRSA by PCR NEGATIVE NEGATIVE Final    Comment:        The GeneXpert MRSA Assay (FDA approved for NASAL specimens only), is one component of a comprehensive MRSA colonization surveillance program. It is not intended to diagnose MRSA infection nor to guide or monitor treatment for MRSA infections. Performed at [facility], [address] [place], [place] [address]     Studies/Results: Dg Chest 2 View  Result Date: 05/30/2018 CLINICAL DATA:  Leukocytosis, altered level of consciousness, found unresponsive  earlier today, history of seizures EXAM: CHEST - 2 VIEW COMPARISON:  None FINDINGS: Normal heart size, mediastinal contours, and pulmonary vascularity. Lungs clear. No pulmonary infiltrate, pleural effusion, or pneumothorax. Question LEFT nipple shadow. No acute osseous findings. IMPRESSION: No acute abnormalities. Question LEFT nipple shadow; repeat PA chest radiograph with nipple markers recommended to exclude pulmonary nodule. Electronically Signed   By: Ulyses SouthwardMark  Boles M.D.   On: 05/30/2018 20:04   Ct Head Wo Contrast  Result Date: 05/30/2018 CLINICAL DATA:  Altered level of consciousness, history of seizures EXAM: CT HEAD WITHOUT CONTRAST TECHNIQUE: Contiguous axial images were obtained from the base of the skull through the vertex without intravenous contrast. COMPARISON:  None FINDINGS: Brain: Normal ventricular morphology. No midline shift or mass effect. Normal appearance of brain parenchyma. No intracranial hemorrhage, mass lesion, or evidence of acute infarction. No extra-axial fluid collections. Vascular: No hyperdense vessels. Skull: Intact Sinuses/Orbits: Mucosal thickening throughout ethmoid air cells bilaterally. Remaining visualized paranasal sinuses and mastoid air cells clear Other: N/A IMPRESSION: No acute intracranial abnormalities. Electronically Signed   By: Ulyses SouthwardMark  Boles M.D.   On: 05/30/2018 20:01   Ct Pelvis W Contrast  Result Date: 05/31/2018 CLINICAL DATA:  Perineal swelling with drainage EXAM: CT PELVIS WITH CONTRAST TECHNIQUE: Multidetector CT imaging of the pelvis was performed using the standard protocol following the bolus administration of intravenous contrast. CONTRAST:  100mL OMNIPAQUE IOHEXOL 300 MG/ML  SOLN COMPARISON:  Ultrasound from  the previous day FINDINGS: Urinary Tract: Bladder is well distended. No obstructive changes are seen. Bowel: Scattered large and small bowel gas is noted. The appendix is within normal limits. No inflammatory changes are noted.  Vascular/Lymphatic: No pathologically enlarged lymph nodes. No significant vascular abnormality seen. Reproductive:  Prostate is within normal limits. Other: The area of clinical concern in the perineum there is a small area of soft tissue prominence which measures approximately 15 mm in greatest craniocaudad dimension. This would correspond to that seen on the recent ultrasound examination. No demonstrative fluid is noted within. 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 No acute bony abnormality is noted. IMPRESSION: Focal small soft tissue prominence in the perineum which corresponds to the given clinical history as well as that seen on recent ultrasound. No large focal abscess is seen. No other focal abnormality is noted. Electronically Signed   By: Alcide Clever M.D.   On: 05/31/2018 09:30   Mr Brain Wo Contrast  Result Date: 05/31/2018 CLINICAL DATA:  42 year old male found unresponsive. Remote history of seizures. EXAM: MRI HEAD WITHOUT CONTRAST TECHNIQUE: Multiplanar, multiecho pulse sequences of the brain and surrounding structures were obtained without intravenous contrast. COMPARISON:  Head CT 05/30/2018. FINDINGS: Study is intermittently degraded by motion artifact despite repeated imaging attempts. Brain: No restricted diffusion to suggest acute infarction. No midline shift, mass effect, evidence of mass lesion, ventriculomegaly, extra-axial collection or acute intracranial hemorrhage. Cervicomedullary junction and pituitary are within normal limits. Wallace Cullens and white matter signal is within normal limits for age throughout the brain. Thin slice coronal imaging of the temporal lobes is motion degraded but the hippocampal formations appear symmetric. No chronic encephalomalacia or chronic cerebral blood products identified. Deep gray matter nuclei appear normal. Vascular: Major intracranial vascular flow voids are preserved. Skull and upper cervical spine:  Negative visible cervical spine. Normal bone marrow signal. Sinuses/Orbits: Negative orbits. Mild to moderate paranasal sinus mucosal thickening most pronounced in the ethmoids. Other: Visible internal auditory structures appear normal. Mastoid air cells are well pneumatized. Scalp and face soft tissues appear negative. IMPRESSION: 1. Mildly motion degraded but overall negative noncontrast MRI appearance of the brain. 2. Ethmoid sinus inflammation. Electronically Signed   By: Odessa Fleming M.D.   On: 05/31/2018 07:25   US Pelvis Limited (transabdominal Only)  Result Date: 05/30/2018 CLINICAL DATA:  Mass on perineum that drains fluid EXAM: LIMITED ULTRASOUND OF PELVIS TECHNIQUE: Limited transabdominal ultrasound examination of the pelvis was performed. COMPARISON:  None. FINDINGS: Targeted ultrasound in the region of concern. Echogenic edema within the soft tissues with soft tissue thickening present. Small complex collection with thickened vascular rim, this measures 1.5 x 0.5 x 1.6 cm. IMPRESSION: 1.5 x 0.5 x 1.6 cm thick-walled collection within the perineum corresponding to the region of concern. This could reflect abscess or inflammatory fluid collection. There is edema and soft tissue thickening of the surrounding soft tissues. Electronically Signed   By: Jasmine Pang M.D.   On: 05/30/2018 21:28   Dg Chest Port 1 View  Result Date: 05/30/2018 CLINICAL DATA:  Altered level of consciousness. Repeat exam with nipple marker EXAM: PORTABLE CHEST 1 VIEW COMPARISON:  Frontal and lateral views earlier this day. FINDINGS: AP semi upright view of the chest obtained with nipple markers. Prior nodular density at the left lung base is not seen. Nipple markers project over the diaphragms. The lungs are otherwise clear, no other interval change. IMPRESSION: Previous nodular density at the left  base is no longer seen, likely represented nipple shadow. Electronically Signed   By: Narda Rutherford M.D.   On: 05/30/2018  22:18    Assessment/Plan:   H/O perineal infections--no active process  He can go from GU standpoint. No f/u needed unless recurrence of abscess   LOS: 1 day   Bertram Millard Avin Upperman 06/01/2018, 10:06 AM

## 2018-06-01 NOTE — Progress Notes (Signed)
Physician Discharge Summary  Faythe DingwallRobert Wessler JYN:[id number] DOB: 10/12/1975 DOA: 05/30/2018  PCP: Patient, No Pcp Per  Admit date: 05/30/2018 Discharge date: 06/01/2018  Admitted From: Home Disposition: Home prior to neurology evaluation   Recommendations for Outpatient Follow-up:  1. Follow up with PCP in 1-2 weeks. 2. Strongly recommend sleep study. 3. Follow up with cardiology, reschedule appointment 4. Consider outpatient neurology follow up.   Home Health: None Equipment/Devices: None Discharge Condition: Stable CODE STATUS: Full Diet recommendation: As tolerated  Brief/Interim Summary: Faythe DingwallRobert Kulesza is a 42 y.o. 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 He had driven from work in [place], [place] and eaten very little. No seizure-like activity was noted, though the patient appeared lethargic and poorly responsive when EMS arrived at the car. On arrival he was hemodynamically stable with unremarkable CMP, leukocytosis to 20.6k without CXR infiltrate and negative CT head. UDS +THC. Perineal nodule was noted for which antibiotics were started and urology consulted. CT pelvis has shown small area also noted on exam without drainable abscess. Clindamycin is continued. WBC now down to 10.8k, remains afebrile. Mental status has returned to baseline. Neurology was consulted and recommended MRI and EEG. MRI has no acute abnormalities to explain symptoms and telemetry has shown no significant arrhythmias. EEG was read by Dr. Amada JupiterKirkpatrick as normal. WBC improved and patient has remained hemodynamically stable. Neurology consultation was requested due to diagnostic uncertainty, though patient left prior to evaluation. He had been cautioned against driving, heavy machinery use, etc.    Discharge Diagnoses:  Principal Problem:   Acute respiratory failure with hypoxia and hypercapnia (HCC) Active Problems:   Scrotal abscess   Syncope  Acute respiratory failure with hypoxia and hypercarbia: Due to hypoventilation related to lethargy and possible undiagnosed OSA.  - Weaned off BiPAP, stable for floor. Suspecting that he has sleep apnea which may also have been the cause of this unresponsive episode.   SSTI, small perineal abscess:  - Plan to continue course of clindamycin for coverage including MRSA. Sent to patient's pharmacy after he left against medical advice.   - Urology recommends no follow up following relatively benign exam and CT findings.   Unresponsiveness: 3rd abnormal episode recently with atypical features for any single diagnosis. Duration of this episode and subsequent lethargy suggests metabolic cause vs. unwitnessed seizure, though nothing revealed in work up thus far aside from small infection. Doubt marijuana is cause for these symptoms, though adulterant is possible, and pt denies recent use. Does not fit with cardiogenic syncope, though does have h/o irregular heartbeat for which he had cardiology follow up appointment scheduled the day of admission.  - No significant events on telemetry, EEG normal. ?Due to OSA. MRI negative and exam normal at this time.  [name]- Urged he needs PCP follow up prior to driving or operating heavy machinery or climbing any heights or swimming/bathing.   Tobacco use:  - Nicotine patch - Smoking cessation recommended.   Marijuana use:  - Cessation recommended  Discharge Instructions  Allergies as of 06/01/2018   No Known Allergies     Medication List    STOP taking these medications   acetaminophen 325 MG tablet Commonly known as:  TYLENOL   diphenhydrAMINE 25 MG tablet Commonly known as:  BENADRYL   EMERGEN-C VITAMIN C Chew     TAKE these medications   clindamycin 300 MG capsule Commonly known as:   CLEOCIN Take 1 capsule (300 mg total) by mouth every 8 (eight) hours.       No Known Allergies  Consultations:  Urology  Neurology  Procedures/Studies: Dg Chest 2 View  Result Date: 05/30/2018 CLINICAL DATA:  Leukocytosis, altered level of consciousness, found unresponsive earlier today, history of seizures EXAM: CHEST - 2 VIEW COMPARISON:  None FINDINGS: Normal heart size, mediastinal contours, and pulmonary vascularity. Lungs clear. No pulmonary infiltrate, pleural effusion, or pneumothorax. Question LEFT nipple shadow. No acute osseous findings. IMPRESSION: No acute abnormalities. Question LEFT nipple shadow; repeat PA chest radiograph with nipple markers recommended to exclude pulmonary nodule. Electronically Signed   By: Ulyses Southward M.D.   On: 05/30/2018 20:04   Ct Head Wo Contrast  Result Date: 05/30/2018 CLINICAL DATA:  Altered level of consciousness, history of seizures EXAM: CT HEAD WITHOUT CONTRAST TECHNIQUE: Contiguous axial images were obtained from the base of the skull through the vertex without intravenous contrast. COMPARISON:  None FINDINGS: Brain: Normal ventricular morphology. No midline shift or mass effect. Normal appearance of brain parenchyma. No intracranial hemorrhage, mass lesion, or evidence of acute infarction. No extra-axial fluid collections. Vascular: No hyperdense vessels. Skull: Intact Sinuses/Orbits: Mucosal thickening throughout ethmoid air cells bilaterally. Remaining visualized paranasal sinuses and mastoid air cells clear Other: N/A IMPRESSION: No acute intracranial abnormalities. Electronically Signed   By: Ulyses Southward M.D.   On: 05/30/2018 20:01   Ct Pelvis W Contrast  Result Date: 05/31/2018 CLINICAL DATA:  Perineal swelling with drainage EXAM: CT PELVIS WITH CONTRAST TECHNIQUE: Multidetector CT imaging of the pelvis was performed using the standard protocol following the bolus administration of intravenous contrast. CONTRAST:  OMNIPAQUE  IOHEXOL 300 MG/ML  SOLN COMPARISON:  Ultrasound from the previous day FINDINGS: Urinary Tract: Bladder is well distended. No obstructive changes are seen. Bowel: Scattered large and small bowel gas is noted. The appendix is within normal limits. No inflammatory changes are noted. Vascular/Lymphatic: No pathologically enlarged lymph nodes. No significant vascular abnormality seen. Reproductive:  Prostate is within normal limits. Other: The area of clinical concern in the perineum there is a small area of soft tissue prominence which measures approximately 15 mm in greatest craniocaudad dimension. This would correspond to that seen on the recent ultrasound examination. No demonstrative fluid is noted within. 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 No acute bony abnormality is noted. IMPRESSION: Focal small soft tissue prominence in the perineum which corresponds to the given clinical history as well as that seen on recent ultrasound. No large focal abscess is seen. No other focal abnormality is noted. Electronically Signed   By: Alcide Clever M.D.   On: 05/31/2018 09:30   Mr Brain Wo Contrast  Result Date: 05/31/2018 CLINICAL DATA:  42 year old male found unresponsive. Remote history of seizures. EXAM: MRI HEAD WITHOUT CONTRAST TECHNIQUE: Multiplanar, multiecho pulse sequences of the brain and surrounding structures were obtained without intravenous contrast. COMPARISON:  Head CT 05/30/2018. FINDINGS: Study is intermittently degraded by motion artifact despite repeated imaging attempts. Brain: No restricted diffusion to suggest acute infarction. No midline shift, mass effect, evidence of mass lesion, ventriculomegaly, extra-axial collection or acute intracranial hemorrhage. Cervicomedullary junction and pituitary are within normal limits. Wallace Cullens and white matter signal is within normal limits for age throughout the brain. Thin slice coronal imaging of the  temporal  lobes is motion degraded but the hippocampal formations appear symmetric. No chronic encephalomalacia or chronic cerebral blood products identified. Deep gray matter nuclei appear normal. Vascular: Major intracranial vascular flow voids are preserved. Skull and upper cervical spine: Negative visible cervical spine. Normal bone marrow signal. Sinuses/Orbits: Negative orbits. Mild to moderate paranasal sinus mucosal thickening most pronounced in the ethmoids. Other: Visible internal auditory structures appear normal. Mastoid air cells are well pneumatized. Scalp and face soft tissues appear negative. IMPRESSION: 1. Mildly motion degraded but overall negative noncontrast MRI appearance of the brain. 2. Ethmoid sinus inflammation. Electronically Signed   By: Odessa Fleming M.D.   On: 05/31/2018 07:25   US Pelvis Limited (transabdominal Only)  Result Date: 05/30/2018 CLINICAL DATA:  Mass on perineum that drains fluid EXAM: LIMITED ULTRASOUND OF PELVIS TECHNIQUE: Limited transabdominal ultrasound examination of the pelvis was performed. COMPARISON:  None. FINDINGS: Targeted ultrasound in the region of concern. Echogenic edema within the soft tissues with soft tissue thickening present. Small complex collection with thickened vascular rim, this measures 1.5 x 0.5 x 1.6 cm. IMPRESSION: 1.5 x 0.5 x 1.6 cm thick-walled collection within the perineum corresponding to the region of concern. This could reflect abscess or inflammatory fluid collection. There is edema and soft tissue thickening of the surrounding soft tissues. Electronically Signed   By: Jasmine Pang M.D.   On: 05/30/2018 21:28   Dg Chest Port 1 View  Result Date: 05/30/2018 CLINICAL DATA:  Altered level of consciousness. Repeat exam with nipple marker EXAM: PORTABLE CHEST 1 VIEW COMPARISON:  Frontal and lateral views earlier this day. FINDINGS: AP semi upright view of the chest obtained with nipple markers. Prior nodular density at the left lung base is not  seen. Nipple markers project over the diaphragms. The lungs are otherwise clear, no other interval change. IMPRESSION: Previous nodular density at the left base is no longer seen, likely represented nipple shadow. Electronically Signed   By: Narda Rutherford M.D.   On: 05/30/2018 22:18    History: 42 year old male being evaluated for loss of consciousness  Sedation: None  Technique: This is a 21 channel routine scalp EEG performed at the bedside with bipolar and monopolar montages arranged in accordance to the international 10/20 system of electrode placement. One channel was dedicated to EKG recording.    Background: The background consists of intermixed alpha and beta activities. There is a well defined posterior dominant rhythm of 8 hz that attenuates with eye opening. Sleep is recorded with normal appearing structures.   Photic stimulation: Physiologic driving is present  EEG Abnormalities: None  Clinical Interpretation: This normal EEG is recorded in the waking and sleep state. There was no seizure or seizure predisposition recorded on this study. Please note that lack of epileptiform activity on EEG does not preclude the possibility of epilepsy.   Ritta Slot, MD  Subjective: No events overnight, wants to go home. No fever or pain or episodes.  Discharge Exam: Vitals:   06/01/18 0740 06/01/18 0800  BP: 125/68   Pulse: 88   Resp: 13 13  Temp: 98.2 F (36.8 C)   SpO2:  99%   General: Pt is alert, awake, not in acute distress Cardiovascular: RRR, S1/S2 +, no rubs, no gallops Respiratory: CTA bilaterally, no wheezing, no rhonchi Abdominal: Soft, NT, ND, bowel sounds + Extremities: No edema, no cyanosis  Labs: BNP (last 3 results) No results for input(s): BNP in the last 8760 hours. Basic Metabolic Panel: Recent Labs  Lab  05/30/18 1909 05/31/18 0531  NA 134* 137  K 3.9 3.8  CL 103 102  CO2 24 28  GLUCOSE 93 75  BUN 15 10  CREATININE 1.01 0.99   CALCIUM 8.8* 8.4*  MG  --  1.9   Liver Function Tests: Recent Labs  Lab 05/31/18 0531  AST 17  ALT 20  ALKPHOS 53  BILITOT 0.6  PROT 6.0*  ALBUMIN 3.6   No results for input(s): LIPASE, AMYLASE in the last 168 hours. No results for input(s): AMMONIA in the last 168 hours. CBC: Recent Labs  Lab 05/30/18 1909 05/31/18 0531 05/31/18 1820  WBC 20.4*  20.6* 10.8* 9.9  NEUTROABS 16.3* 5.8  --   HGB 14.3  14.5 12.6* 12.6*  HCT 45.1  44.4 38.8* 40.1  MCV 93.6  93.3 93.0 93.5  PLT 294  276 218 239   Cardiac Enzymes: Recent Labs  Lab 05/31/18 0531 05/31/18 1046 05/31/18 1820  CKTOTAL 90  --   --   TROPONINI 0.04* <0.03 <0.03   BNP: Invalid input(s): POCBNP CBG: Recent Labs  Lab 05/30/18 1855  GLUCAP 92   D-Dimer Recent Labs    05/30/18 1909  DDIMER <0.27   Hgb A1c No results for input(s): HGBA1C in the last 72 hours. Lipid Profile No results for input(s): CHOL, HDL, LDLCALC, TRIG, CHOLHDL, LDLDIRECT in the last 72 hours. Thyroid function studies Recent Labs    05/31/18 0531  TSH 3.306   Anemia work up No results for input(s): VITAMINB12, FOLATE, FERRITIN, TIBC, IRON, RETICCTPCT in the last 72 hours. Urinalysis    Component Value Date/Time   COLORURINE YELLOW 05/30/2018 1910   APPEARANCEUR CLEAR 05/30/2018 1910   LABSPEC >1.030 (H) 05/30/2018 1910   PHURINE 6.0 05/30/2018 1910   GLUCOSEU >=500 (A) 05/30/2018 1910   HGBUR NEGATIVE 05/30/2018 1910   BILIRUBINUR NEGATIVE 05/30/2018 1910   KETONESUR NEGATIVE 05/30/2018 1910   PROTEINUR NEGATIVE 05/30/2018 1910   NITRITE NEGATIVE 05/30/2018 1910   LEUKOCYTESUR NEGATIVE 05/30/2018 1910    Microbiology Recent Results (from the past 240 hour(s))  MRSA PCR Screening     Status: None   Collection Time: 05/31/18  3:57 AM  Result Value Ref Range Status   MRSA by PCR NEGATIVE NEGATIVE Final    Comment:        The GeneXpert MRSA Assay (FDA approved for NASAL specimens only), is one component of  a comprehensive MRSA colonization surveillance program. It is not intended to diagnose MRSA infection nor to guide or monitor treatment for MRSA infections. Performed at [facility], [address] [place], [place] [address]     Time coordinating discharge: Approximately 40 minutes  Tyrone Nine, MD  [facility] 06/01/2018, 1:13 PM Pager [phone]

## 2018-06-01 NOTE — Care Management Note (Signed)
Case Management Note  Patient Details  Name: Ricardo DingwallRobert Blair MRN: [id number] Date of Birth: 07/11/1975  Subjective/Objective:                    Action/Plan: CM went to provide patient medication assistance card for his d/c medication. Bedside RN states patient left prior to d/c being completed. Pt's spouse with him so had transportation. No further needs per CM.   Expected Discharge Date:  06/01/18               Expected Discharge Plan:  Home/Self Care  In-House Referral:     Discharge planning Services     Post Acute Care Choice:    Choice offered to:     DME Arranged:    DME Agency:     HH Arranged:    HH Agency:     Status of Service:  Completed, signed off  If discussed at [employer] Length of Stay Meetings, dates discussed:    Additional Comments:  Kermit BaloKelli F Jes Costales, RN 06/01/2018, 1:58 PM

## 2018-06-01 NOTE — Plan of Care (Signed)

## 2018-06-05 NOTE — Discharge Summary (Signed)
Physician Discharge Summary  Ricardo Blair:[id number] DOB: Oct 18, 1975 DOA: 05/30/2018  PCP: Patient, No Pcp Per  Admit date: 05/30/2018 Discharge date: 06/01/2018 - Please note previous discharge summary erroneously labeled as progress note. This is a duplicate of that summary.  Admitted From: Home Disposition: Home prior to neurology evaluation   Recommendations for Outpatient Follow-up:  1. Follow up with PCP in 1-2 weeks. 2. Strongly recommend sleep study. 3. Follow up with cardiology, reschedule appointment 4. Consider outpatient neurology follow up.   Home Health: None Equipment/Devices: None Discharge Condition: Stable CODE STATUS: Full Diet recommendation: As tolerated  Brief/Interim Summary: Ricardo Blair is a 42 y.o. 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 He had driven from work in [place], [place] and eaten very little. No seizure-like activity was noted, though the patient appeared lethargic and poorly responsive when EMS arrived at the car. On arrival he was hemodynamically stable with unremarkable CMP, leukocytosis to 20.6k without CXR infiltrate and negative CT head. UDS +THC. Perineal nodule was noted for which antibiotics were started and urology consulted. CT pelvis has shown small area also noted on exam without drainable abscess. Clindamycin is continued. WBC now down to 10.8k, remains afebrile. Mental status has returned to baseline. Neurology was consulted and recommended MRI and EEG. MRI has no acute abnormalities to explain symptoms and telemetry has shown no significant arrhythmias. EEG was read by Dr. Amada Jupiter as normal. WBC improved and patient has remained hemodynamically stable. Neurology consultation was requested due to diagnostic uncertainty,  though patient left prior to evaluation. He had been cautioned against driving, heavy machinery use, etc.   Discharge Diagnoses:  Principal Problem:   Acute respiratory failure with hypoxia and hypercapnia (HCC) Active Problems:   Scrotal abscess   Syncope  Acute respiratory failure with hypoxia and hypercarbia: Due to hypoventilation related to lethargy and possible undiagnosed OSA.  - Weaned off BiPAP, stable for floor. Suspecting that he has sleep apnea which may also have been the cause of this unresponsive episode.   SSTI, small perineal abscess:  - Plan to continue course of clindamycin for coverage including MRSA. Sent to patient's pharmacy after he left against medical advice.   - Urology recommends no follow up following relatively benign exam and CT findings.   Unresponsiveness: 3rd abnormal episode recently with atypical features for any single diagnosis. Duration of this episode and subsequent lethargy suggests metabolic cause vs. unwitnessed seizure, though nothing revealed in work up thus far aside from small infection. Doubt marijuana is cause for these symptoms, though adulterant is possible, and pt denies recent use. Does not fit with cardiogenic syncope, though does have h/o irregular heartbeat for which he had cardiology follow up appointment scheduled the day of admission.  - No significant events on telemetry, EEG normal. ?Due to OSA. MRI negative and exam normal at this time.  [name] he needs PCP follow up prior to driving or operating heavy machinery or climbing any heights or swimming/bathing.   Tobacco use:  - Nicotine patch - Smoking cessation recommended.   Marijuana use:  - Cessation recommended  Discharge Instructions  Allergies as of 06/01/2018   No Known Allergies     Medication List    STOP taking these medications   acetaminophen  325 MG tablet Commonly known as:  TYLENOL   diphenhydrAMINE 25 MG tablet Commonly known as:  BENADRYL    EMERGEN-C VITAMIN C Chew     TAKE these medications   clindamycin 300 MG capsule Commonly known as:  CLEOCIN Take 1 capsule (300 mg total) by mouth every 8 (eight) hours.      Follow-up Information    [facility] Follow up.   Why:  Call Monday am for a hospital f/u appointment. if unable to get an appointmet with this clinic call: [facility]: [phone] Contact information: [address] [place] [address] [phone]         No Known Allergies  Consultations:  Urology  Neurology  Procedures/Studies: Dg Chest 2 View  Result Date: 05/30/2018 CLINICAL DATA:  Leukocytosis, altered level of consciousness, found unresponsive earlier today, history of seizures EXAM: CHEST - 2 VIEW COMPARISON:  None FINDINGS: Normal heart size, mediastinal contours, and pulmonary vascularity. Lungs clear. No pulmonary infiltrate, pleural effusion, or pneumothorax. Question LEFT nipple shadow. No acute osseous findings. IMPRESSION: No acute abnormalities. Question LEFT nipple shadow; repeat PA chest radiograph with nipple markers recommended to exclude pulmonary nodule. Electronically Signed   By: Ulyses Southward M.D.   On: 05/30/2018 20:04   Ct Head Wo Contrast  Result Date: 05/30/2018 CLINICAL DATA:  Altered level of consciousness, history of seizures EXAM: CT HEAD WITHOUT CONTRAST TECHNIQUE: Contiguous axial images were obtained from the base of the skull through the vertex without intravenous contrast. COMPARISON:  None FINDINGS: Brain: Normal ventricular morphology. No midline shift or mass effect. Normal appearance of brain parenchyma. No intracranial hemorrhage, mass lesion, or evidence of acute infarction. No extra-axial fluid collections. Vascular: No hyperdense vessels. Skull: Intact Sinuses/Orbits: Mucosal thickening throughout ethmoid air cells bilaterally. Remaining visualized paranasal sinuses and mastoid air cells clear  Other: N/A IMPRESSION: No acute intracranial abnormalities. Electronically Signed   By: Ulyses Southward M.D.   On: 05/30/2018 20:01   Ct Pelvis W Contrast  Result Date: 05/31/2018 CLINICAL DATA:  Perineal swelling with drainage EXAM: CT PELVIS WITH CONTRAST TECHNIQUE: Multidetector CT imaging of the pelvis was performed using the standard protocol following the bolus administration of intravenous contrast. CONTRAST:  OMNIPAQUE IOHEXOL 300 MG/ML  SOLN COMPARISON:  Ultrasound from the previous day FINDINGS: Urinary Tract: Bladder is well distended. No obstructive changes are seen. Bowel: Scattered large and small bowel gas is noted. The appendix is within normal limits. No inflammatory changes are noted. Vascular/Lymphatic: No pathologically enlarged lymph nodes. No significant vascular abnormality seen. Reproductive:  Prostate is within normal limits. Other: The area of clinical concern in the perineum there is a small area of soft tissue prominence which measures approximately 15 mm in greatest craniocaudad dimension. This would correspond to that seen on the recent ultrasound examination. No demonstrative fluid is noted within. 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 No acute bony abnormality is noted. IMPRESSION: Focal small soft tissue prominence in the perineum which corresponds to the given clinical history as well as that seen on recent ultrasound. No large focal abscess is seen. No other focal abnormality is noted. Electronically Signed   By: Alcide Clever M.D.   On: 05/31/2018 09:30   Mr Brain Wo Contrast  Result Date: 05/31/2018 CLINICAL DATA:  42 year old male found unresponsive. Remote history of seizures. EXAM: MRI HEAD WITHOUT CONTRAST TECHNIQUE: Multiplanar, multiecho pulse sequences of the brain and  surrounding structures were obtained without intravenous contrast. COMPARISON:  Head CT 05/30/2018. FINDINGS: Study is intermittently degraded  by motion artifact despite repeated imaging attempts. Brain: No restricted diffusion to suggest acute infarction. No midline shift, mass effect, evidence of mass lesion, ventriculomegaly, extra-axial collection or acute intracranial hemorrhage. Cervicomedullary junction and pituitary are within normal limits. Wallace CullensGray and white matter signal is within normal limits for age throughout the brain. Thin slice coronal imaging of the temporal lobes is motion degraded but the hippocampal formations appear symmetric. No chronic encephalomalacia or chronic cerebral blood products identified. Deep gray matter nuclei appear normal. Vascular: Major intracranial vascular flow voids are preserved. Skull and upper cervical spine: Negative visible cervical spine. Normal bone marrow signal. Sinuses/Orbits: Negative orbits. Mild to moderate paranasal sinus mucosal thickening most pronounced in the ethmoids. Other: Visible internal auditory structures appear normal. Mastoid air cells are well pneumatized. Scalp and face soft tissues appear negative. IMPRESSION: 1. Mildly motion degraded but overall negative noncontrast MRI appearance of the brain. 2. Ethmoid sinus inflammation. Electronically Signed   By: Odessa FlemingH  Hall M.D.   On: 05/31/2018 07:25   [place] Pelvis Limited (transabdominal Only)  Result Date: 05/30/2018 CLINICAL DATA:  Mass on perineum that drains fluid EXAM: LIMITED ULTRASOUND OF PELVIS TECHNIQUE: Limited transabdominal ultrasound examination of the pelvis was performed. COMPARISON:  None. FINDINGS: Targeted ultrasound in the region of concern. Echogenic edema within the soft tissues with soft tissue thickening present. Small complex collection with thickened vascular rim, this measures 1.5 x 0.5 x 1.6 cm. IMPRESSION: 1.5 x 0.5 x 1.6 cm thick-walled collection within the perineum corresponding to the region of concern. This could reflect abscess or inflammatory fluid collection. There is edema and soft tissue thickening of the  surrounding soft tissues. Electronically Signed   By: Jasmine PangKim  Fujinaga M.D.   On: 05/30/2018 21:28   Dg Chest Port 1 View  Result Date: 05/30/2018 CLINICAL DATA:  Altered level of consciousness. Repeat exam with nipple marker EXAM: PORTABLE CHEST 1 VIEW COMPARISON:  Frontal and lateral views earlier this day. FINDINGS: AP semi upright view of the chest obtained with nipple markers. Prior nodular density at the left lung base is not seen. Nipple markers project over the diaphragms. The lungs are otherwise clear, no other interval change. IMPRESSION: Previous nodular density at the left base is no longer seen, likely represented nipple shadow. Electronically Signed   By: Narda RutherfordMelanie  Sanford M.D.   On: 05/30/2018 22:18    History: 42 year old male being evaluated for loss of consciousness  Sedation: None  Technique: This is a 21 channel routine scalp EEG performed at the bedside with bipolar and monopolar montages arranged in accordance to the international 10/20 system of electrode placement. One channel was dedicated to EKG recording.    Background: The background consists of intermixed alpha and beta activities. There is a well defined posterior dominant rhythm of 8 hz that attenuates with eye opening. Sleep is recorded with normal appearing structures.   Photic stimulation: Physiologic driving is present  EEG Abnormalities: None  Clinical Interpretation: This normal EEG is recorded in the waking and sleep state. There was no seizure or seizure predisposition recorded on this study. Please note that lack of epileptiform activity on EEG does not preclude the possibility of epilepsy.   Ritta SlotMcNeill Kirkpatrick, MD  Subjective: No events overnight, wants to go home. No fever or pain or episodes.  Discharge Exam: Vitals:   06/01/18 0740 06/01/18 0800  BP: 125/68   Pulse: 88  Resp: 13 13  Temp: 98.2 F (36.8 C)   SpO2:  99%   General: Pt is alert, awake, not in acute  distress Cardiovascular: RRR, S1/S2 +, no rubs, no gallops Respiratory: CTA bilaterally, no wheezing, no rhonchi Abdominal: Soft, NT, ND, bowel sounds + Extremities: No edema, no cyanosis  Labs: BNP (last 3 results) No results for input(s): BNP in the last 8760 hours. Basic Metabolic Panel: Recent Labs  Lab 05/30/18 1909 05/31/18 0531  NA 134* 137  K 3.9 3.8  CL 103 102  CO2 24 28  GLUCOSE 93 75  BUN 15 10  CREATININE 1.01 0.99  CALCIUM 8.8* 8.4*  MG  --  1.9   Liver Function Tests: Recent Labs  Lab 05/31/18 0531  AST 17  ALT 20  ALKPHOS 53  BILITOT 0.6  PROT 6.0*  ALBUMIN 3.6   No results for input(s): LIPASE, AMYLASE in the last 168 hours. No results for input(s): AMMONIA in the last 168 hours. CBC: Recent Labs  Lab 05/30/18 1909 05/31/18 0531 05/31/18 1820  WBC 20.4*  20.6* 10.8* 9.9  NEUTROABS 16.3* 5.8  --   HGB 14.3  14.5 12.6* 12.6*  HCT 45.1  44.4 38.8* 40.1  MCV 93.6  93.3 93.0 93.5  PLT 294  276 218 239   Cardiac Enzymes: Recent Labs  Lab 05/31/18 0531 05/31/18 1046 05/31/18 1820  CKTOTAL 90  --   --   TROPONINI 0.04* <0.03 <0.03   BNP: Invalid input(s): POCBNP CBG: Recent Labs  Lab 05/30/18 1855  GLUCAP 92   D-Dimer No results for input(s): DDIMER in the last 72 hours. Hgb A1c No results for input(s): HGBA1C in the last 72 hours. Lipid Profile No results for input(s): CHOL, HDL, LDLCALC, TRIG, CHOLHDL, LDLDIRECT in the last 72 hours. Thyroid function studies No results for input(s): TSH, T4TOTAL, T3FREE, THYROIDAB in the last 72 hours.  Invalid input(s): FREET3 Anemia work up No results for input(s): VITAMINB12, FOLATE, FERRITIN, TIBC, IRON, RETICCTPCT in the last 72 hours. Urinalysis    Component Value Date/Time   COLORURINE YELLOW 05/30/2018 1910   APPEARANCEUR CLEAR 05/30/2018 1910   LABSPEC >1.030 (H) 05/30/2018 1910   PHURINE 6.0 05/30/2018 1910   GLUCOSEU >=500 (A) 05/30/2018 1910   HGBUR NEGATIVE 05/30/2018  1910   BILIRUBINUR NEGATIVE 05/30/2018 1910   KETONESUR NEGATIVE 05/30/2018 1910   PROTEINUR NEGATIVE 05/30/2018 1910   NITRITE NEGATIVE 05/30/2018 1910   LEUKOCYTESUR NEGATIVE 05/30/2018 1910    Microbiology Recent Results (from the past 240 hour(s))  MRSA PCR Screening     Status: None   Collection Time: 05/31/18  3:57 AM  Result Value Ref Range Status   MRSA by PCR NEGATIVE NEGATIVE Final    Comment:        The GeneXpert MRSA Assay (FDA approved for NASAL specimens only), is one component of a comprehensive MRSA colonization surveillance program. It is not intended to diagnose MRSA infection nor to guide or monitor treatment for MRSA infections. Performed at [facility], [address], [place], [place] [address]     Time coordinating discharge: Approximately 40 minutes  Tyrone Nine, MD  [facility] 06/05/2018, 7:25 PM Pager [phone]

## 2018-06-25 NOTE — Progress Notes (Deleted)
Cardiology Office Note:    Date:  06/25/2018   ID:  Ricardo Dingwallobert Blair, DOB 12/18/1975, MRN [id number]  PCP:  Patient, No Pcp Per  Cardiologist:  Norman HerrlichBrian Junette Bernat, MD   Referring MD: No ref. provider found  ASSESSMENT:    No diagnosis found. PLAN:    In order of problems listed above:  1. ***  Next appointment   Medication Adjustments/Labs and Tests Ordered: Current medicines are reviewed at length with the patient today.  Concerns regarding medicines are outlined above.  No orders of the defined types were placed in this encounter.  No orders of the defined types were placed in this encounter.    No chief complaint on file. ***  History of Present Illness:    Ricardo Blair is a 42 y.o. male who is being seen today for the evaluation of syncope at the request of hospital physician Dr. Jarvis NewcomerGrunz at discharge 06/01/2018.  The admission was prompted by loss of consciousness with prolonged unresponsive episode and altered mental status  and with a  no arrhythmia documented background history of previous drug abuse and seizure disorder.  While in hospital he is found to have hypoxemia.  Other occurrences included a scrotal abscess.  In October he relates an episode of pain due to trauma to the hand and brief loss of consciousness.  No EKG appears to been performed there is no attempt to place his event monitor but it fell off and the patient lost it after 4 days.  His EKG 06/01/2018 shows sinus rhythm right and left atrial enlargement.  He had a minimal troponin elevation 0.04 which was flat not consistent with acute cardiac injury or ischemia.  Drug screen was abnormal for THC.   Past Medical History:  Diagnosis Date  . Seizures (HCC)     No past surgical history on file.  Current Medications: No outpatient medications have been marked as taking for the 06/28/18 encounter (Appointment) with Baldo DaubMunley, Betsaida Missouri J, MD.     Allergies:   Patient has no known allergies.   Social History    Socioeconomic History  . Marital status: Married    Spouse name: Not on file  . Number of children: Not on file  . Years of education: Not on file  . Highest education level: Not on file  Occupational History  . Not on file  Social Needs  . Financial resource strain: Somewhat hard  . Food insecurity:    Worry: Sometimes true    Inability: Sometimes true  . Transportation needs:    Medical: No    Non-medical: No  Tobacco Use  . Smoking status: Current Every Day Smoker    Types: Cigarettes  . Smokeless tobacco: Former Engineer, waterUser  Substance and Sexual Activity  . Alcohol use: Yes    Comment: 3x month  . Drug use: Not Currently    Types: Marijuana  . Sexual activity: Not on file  Lifestyle  . Physical activity:    Days per week: 1 day    Minutes per session: 10 min  . Stress: Rather much  Relationships  . Social connections:    Talks on phone: Twice a week    Gets together: Once a week    Attends religious service: Never    Active member of club or organization: No    Attends meetings of clubs or organizations: Never    Relationship status: Married  Other Topics Concern  . Not on file  Social History Narrative  . Not on file  Family History: The patient's ***Family history is unknown by patient.  ROS:   Review of Systems  Constitution: Negative.  HENT: Negative.   Eyes: Negative.   Cardiovascular: Positive for palpitations and syncope.  Respiratory: Negative.   Endocrine: Negative.   Hematologic/Lymphatic: Negative.   Skin: Negative.   Musculoskeletal: Negative.   Gastrointestinal: Negative.   Genitourinary: Negative.   Psychiatric/Behavioral: Negative.    Please see the history of present illness.    *** All other systems reviewed and are negative.  EKGs/Labs/Other Studies Reviewed:    The following studies were reviewed today: ***  EKG: EKG 06/01/2018 independently reviewed sinus rhythm biatrial enlargement. Recent Labs: 05/31/2018: ALT 20; BUN  10; Creatinine, Ser 0.99; Hemoglobin 12.6; Magnesium 1.9; Platelets 239; Potassium 3.8; Sodium 137; TSH 3.306  Recent Lipid Panel No results found for: CHOL, TRIG, HDL, CHOLHDL, VLDL, LDLCALC, LDLDIRECT  Physical Exam:    VS:  There were no vitals taken for this visit.    Wt Readings from Last 3 Encounters:  05/31/18 177 lb 4 oz (80.4 kg)     GEN: *** Well nourished, well developed in no acute distress HEENT: Normal NECK: No JVD; No carotid bruits LYMPHATICS: No lymphadenopathy CARDIAC: ***RRR, no murmurs, rubs, gallops RESPIRATORY:  Clear to auscultation without rales, wheezing or rhonchi  ABDOMEN: Soft, non-tender, non-distended MUSCULOSKELETAL:  No edema; No deformity  SKIN: Warm and dry NEUROLOGIC:  Alert and oriented x 3 PSYCHIATRIC:  Normal affect     Signed, Norman HerrlichBrian Rucha Wissinger, MD  06/25/2018 8:23 AM    [facility]

## 2018-06-28 ENCOUNTER — Encounter: Payer: Self-pay | Admitting: Cardiology

## 2018-06-28 ENCOUNTER — Ambulatory Visit (INDEPENDENT_AMBULATORY_CARE_PROVIDER_SITE_OTHER): Payer: BLUE CROSS/BLUE SHIELD | Admitting: Cardiology

## 2018-06-28 VITALS — BP 128/70 | HR 63 | Ht 75.0 in | Wt 182.0 lb

## 2018-06-28 VITALS — BP 128/70 | HR 68 | Ht 75.0 in | Wt 182.0 lb

## 2018-06-28 DIAGNOSIS — I471 Supraventricular tachycardia, unspecified: Secondary | ICD-10-CM

## 2018-06-28 DIAGNOSIS — R002 Palpitations: Secondary | ICD-10-CM | POA: Diagnosis not present

## 2018-06-28 DIAGNOSIS — R4020 Unspecified coma: Secondary | ICD-10-CM | POA: Diagnosis not present

## 2018-06-28 DIAGNOSIS — R001 Bradycardia, unspecified: Secondary | ICD-10-CM

## 2018-06-28 NOTE — Progress Notes (Signed)
Cardiology Office Note:    Date:  06/28/2018   ID:  Ricardo Blair, DOB 10/14/1975, MRN [id number]  PCP:  Patient, No Pcp Per  Cardiologist:  Norman HerrlichBrian Khiyan Crace, MD   Referring MD: No ref. provider found  ASSESSMENT:    1. Loss of consciousness (HCC)   2. Palpitation    PLAN:     His monitor was accessed after the visit it shows the presence of tachyarrhythmia he had 80 episodes of supraventricular tachycardia the fastest one lasted 19 minutes 53 seconds at a rate of 220 bpm longest 122 minutes and 16 seconds at a rate of 170.  He also had episodes of high degree heart block rates at the range of 26 bpm at times escape rhythm lasting a total of 38 seconds.  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   In order of problems listed above:  1. Admission with very atypical was not syncope most consistent with encephalopathy due to his scrotal abscess.  It is resolved. 2. Associated with abnormal event monitor questionable atrial fibrillation and syncope in the past.  Unfortunately no matter what I do I cannot access that report today will call the physicians and [place] and we send a stat release for records and I will see him at the first opportunity back in the office to resolve if he does indeed have arrhythmia and whether he needs to be treated with symptomatic status with syncope.  Next appointment in the next few weeks   Medication Adjustments/Labs and Tests Ordered: Current medicines are reviewed at length with the patient today.  Concerns regarding medicines are outlined above.  No orders of the defined types were placed in this encounter.  No orders of the defined types were placed in this encounter.    Chief Complaint  Patient presents with  . Follow-up    after admission with LOC    History of Present Illness:      Ricardo Blair is a 42 y.o. male who is being seen today for the evaluation of LOC at the request of  Tyrone NineGrunz, Ryan B, MD  Physician  Hospitalist     He was recently admitted to [facility] 05/30/2018 to 06/01/2018 with prolonged episode of unresponsiveness and altered mental status.  No arrhythmia was documented.  He was found to have scrotal abscess and hypoxemia that resolved with inpatient treatment.  EEG was performed felt to be normal.  He had a background history of syncope in [place] he wore a ZIO monitor was told was abnormal with irregular heartbeat and advised to see a cardiologist.  He thought that I had a copy this report and we did everything we could in the office to access it today and we cannot.  He describes intermittent episodes of his heart racing rapidly associated weakness and one episode of syncope at work the episodes last usually minutes up to an hour and 10 to stop spontaneously.  He had an episode of syncope working with a forklift was sent to the emergency room by the time he was seen it was normal and his wife tells me that his lab work and echocardiogram were normal but that a extended outpatient monitor was abnormal he is advised to see a cardiologist.  He takes no over-the-counter proarrhythmic drugs he has no history  of congenital or rheumatic heart disease.  He is very anxious to know the results of his outpatient monitor and treatment if indicated.   Past Medical History:  Diagnosis Date  . Seizures (HCC)     No past surgical history on file.  Current Medications: No outpatient medications have been marked as taking for the 06/28/18 encounter (Office Visit) with Baldo Daub, MD.     Allergies:   Patient has no known allergies.   Social History   Socioeconomic History  . Marital status: Married    Spouse name: Not on file  . Number of children: Not on file  . Years of education: Not on file  . Highest education level: Not on file   Occupational History  . Not on file  Social Needs  . Financial resource strain: Somewhat hard  . Food insecurity:    Worry: Sometimes true    Inability: Sometimes true  . Transportation needs:    Medical: No    Non-medical: No  Tobacco Use  . Smoking status: Current Every Day Smoker    Types: Cigarettes  . Smokeless tobacco: Former Engineer, water and Sexual Activity  . Alcohol use: Yes    Comment: 3x month  . Drug use: Not Currently    Types: Marijuana  . Sexual activity: Not on file  Lifestyle  . Physical activity:    Days per week: 1 day    Minutes per session: 10 min  . Stress: Rather much  Relationships  . Social connections:    Talks on phone: Twice a week    Gets together: Once a week    Attends religious service: Never    Active member of club or organization: No    Attends meetings of clubs or organizations: Never    Relationship status: Married  Other Topics Concern  . Not on file  Social History Narrative  . Not on file     Family History: The patient's Family history is unknown by patient.  ROS:   Review of Systems  Constitution: Negative.  HENT: Negative.   Eyes: Negative.   Cardiovascular: Positive for palpitations and syncope.  Respiratory: Negative.   Endocrine: Negative.   Hematologic/Lymphatic: Negative.   Skin: Negative.   Musculoskeletal: Negative.   Genitourinary: Negative.   Neurological: Positive for dizziness.  Psychiatric/Behavioral: Positive for altered mental status.  Allergic/Immunologic: Negative.    Please see the history of present illness.     All other systems reviewed and are negative.  EKGs/Labs/Other Studies Reviewed:    The following studies were reviewed today:   EKG: [facility] ED independently reviewed normal  Recent Labs: 05/31/2018: ALT 20; BUN 10; Creatinine, Ser 0.99; Hemoglobin 12.6; Magnesium 1.9; Platelets 239; Potassium 3.8; Sodium 137; TSH 3.306  Recent Lipid Panel No results found for:  CHOL, TRIG, HDL, CHOLHDL, VLDL, LDLCALC, LDLDIRECT  Physical Exam:    VS:  BP 128/70   Pulse 63   Ht 6\' 3"  (1.905 m)   Wt 182 lb (82.6 kg)   SpO2 98%   BMI 22.75 kg/m     Wt Readings from Last 3 Encounters:  06/28/18 182 lb (82.6 kg)  06/28/18 182 lb (82.6 kg)  05/31/18 177 lb 4 oz (80.4 kg)     GEN:  Well nourished, well developed in no acute distress HEENT: Normal NECK: No JVD; No carotid bruits LYMPHATICS: No lymphadenopathy CARDIAC: RRR, no murmurs, rubs, gallops RESPIRATORY:  Clear to auscultation without rales, wheezing or rhonchi  ABDOMEN: Soft, non-tender, non-distended MUSCULOSKELETAL:  No edema; No deformity  SKIN: Warm and dry NEUROLOGIC:  Alert and oriented x 3 PSYCHIATRIC:  Normal affect     Signed, Norman HerrlichBrian Even Budlong, MD  06/28/2018 1:38 PM    [facility]

## 2018-06-28 NOTE — Addendum Note (Signed)
Addended by: Crist FatLOCKHART, CATHERINE P on: 06/28/2018 03:21 PM   Modules accepted: Orders

## 2018-06-28 NOTE — Patient Instructions (Addendum)
Medication Instructions:  Your physician recommends that you continue on your current medications as directed. Please refer to the Current Medication list given to you today.  If you need a refill on your cardiac medications before your next appointment, please call your pharmacy.   Lab work: None  If you have labs (blood work) drawn today and your tests are completely normal, you will receive your results only by: Marland Kitchen. MyChart Message (if you have MyChart) OR . A paper copy in the mail If you have any lab test that is abnormal or we need to change your treatment, we will call you to review the results.  Testing/Procedures: You had an EKG today.   You have been referred to see an electrophysiologist at the [facility] office in [place]. You will be contacted to schedule this appointment.   Follow-Up: At [facility], you and your health needs are our priority.  As part of our continuing mission to provide you with exceptional heart care, we have created designated Provider Care Teams.  These Care Teams include your primary Cardiologist (physician) and Advanced Practice Providers (APPs -  Physician Assistants and Nurse Practitioners) who all work together to provide you with the care you need, when you need it. You will need a follow up appointment on 07/16/2018 at 1: 20 pm with Dr. Dulce SellarMunley.

## 2018-07-03 ENCOUNTER — Institutional Professional Consult (permissible substitution): Payer: Self-pay | Admitting: Cardiology

## 2018-07-09 ENCOUNTER — Institutional Professional Consult (permissible substitution): Payer: Self-pay | Admitting: Cardiology

## 2018-07-16 ENCOUNTER — Ambulatory Visit: Payer: Self-pay | Admitting: Cardiology

## 2018-07-19 ENCOUNTER — Encounter: Payer: Self-pay | Admitting: *Deleted

## 2018-07-30 ENCOUNTER — Encounter: Payer: Self-pay | Admitting: *Deleted

## 2018-07-30 ENCOUNTER — Ambulatory Visit (INDEPENDENT_AMBULATORY_CARE_PROVIDER_SITE_OTHER): Payer: BLUE CROSS/BLUE SHIELD | Admitting: Cardiology

## 2018-07-30 ENCOUNTER — Encounter: Payer: Self-pay | Admitting: Cardiology

## 2018-07-30 ENCOUNTER — Other Ambulatory Visit: Payer: Self-pay | Admitting: Cardiology

## 2018-07-30 VITALS — BP 140/80 | HR 61 | Ht 75.0 in | Wt 184.4 lb

## 2018-07-30 DIAGNOSIS — I471 Supraventricular tachycardia, unspecified: Secondary | ICD-10-CM

## 2018-07-30 MED ORDER — DILTIAZEM HCL ER COATED BEADS 120 MG PO CP24: 120.0000 mg | ORAL_CAPSULE | Freq: Every day | ORAL | 3 refills | Status: DC

## 2018-07-30 NOTE — Progress Notes (Signed)
Electrophysiology Office Note   Date:  07/30/2018   ID:  Ricardo DingwallRobert Blair, DOB 10/20/1975, MRN [id number]  PCP:  Patient, No Pcp Per  Cardiologist:  Dulce SellarMunley Primary Electrophysiologist:  Elisabel Hanover Jorja LoaMartin Sabrine Patchen, MD    Chief Complaint  Patient presents with  . Other    Referred by Dr. Dulce SellarMunley for abnormal ZIO. Patient denies chest pain, Dizziness, and swelling at this time. Meds reviewed verbally with with patient.      History of Present Illness: Ricardo Blair is a 43 y.o. male who is being seen today for the evaluation of SVT, syncope at the request of Dulce SellarMunley, Iline OvenBrian J, MD. Presenting today for electrophysiology evaluation.  He was admitted to [facility] 05/30/2018 due to a prolonged episode of unresponsiveness and altered mental status.  He was found to have a normal EEG.  He had an episode of syncope in [place] in which she wore a ZIO monitor.  He has had intermittent episodes of palpitations associated with weakness and one episode of syncope while at work.  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  He was sent to the emergency room at the time.  His cardiac monitor showed multiple SVT episodes at up to 220 bpm.  He has palpitations at least once a week.  Palpitations last for a few minutes, but have lasted hours at a time.  He says that he can sit down and take deep breaths with sometimes terminates his palpitations.    Today, he denies symptoms of palpitations, chest pain, shortness of breath, orthopnea, PND, lower extremity edema, claudication, dizziness, presyncope, syncope, bleeding, or neurologic sequela. The patient is tolerating medications without difficulties.    Past Medical History:  Diagnosis Date  . Seizures (HCC)    History reviewed. No pertinent surgical history.   No current outpatient medications on file.   No current facility-administered medications for this visit.     Allergies:   Patient has no known allergies.   Social History:   The patient  reports that he has been smoking cigarettes. He has quit using smokeless tobacco. He reports current alcohol use. He reports previous drug use. Drug: Marijuana.   Family History:  The patient's Family history is unknown by patient.    ROS:  Please see the history of present illness.   Otherwise, review of systems is positive for none.   All other systems are reviewed and negative.    PHYSICAL EXAM: VS:  BP 140/80 (BP Location: Right Arm, Patient Position: Sitting, Cuff Size: Normal)   Pulse 61   Ht 6\' 3"  (1.905 m)   Wt 184 lb 6 oz (83.6 kg)   BMI 23.05 kg/m  , BMI Body mass index is 23.05 kg/m. GEN: Well nourished, well developed, in no acute distress  HEENT: normal  Neck: no JVD, carotid bruits, or masses Cardiac: RRR; no murmurs, rubs, or gallops,no edema  Respiratory:  clear to auscultation bilaterally, normal work of breathing GI: soft, nontender, nondistended, + BS MS: no deformity or atrophy  Skin: warm and dry,  Neuro:  Strength and sensation are intact Psych: euthymic mood, full affect  EKG:  EKG is ordered today. Personal review of the ekg ordered shows SR, rate 61  Recent Labs: 05/31/2018: ALT 20; BUN 10; Creatinine, Ser 0.99; Hemoglobin 12.6; Magnesium 1.9; Platelets 239; Potassium 3.8; Sodium 137; TSH 3.306    Lipid Panel  No results found for: CHOL, TRIG, HDL, CHOLHDL, VLDL, LDLCALC, LDLDIRECT   Wt Readings  from Last 3 Encounters:  07/30/18 184 lb 6 oz (83.6 kg)  06/28/18 182 lb (82.6 kg)  06/28/18 182 lb (82.6 kg)      Other studies Reviewed: Additional studies/ records that were reviewed today include: TTE 04/16/18 Review of the above records today demonstrates:  Mall LV systolic function Mild concentric LVH Mildly to moderately dilated right ventricle Mildly to moderately dilated right atrium Pleural effusion   ASSESSMENT AND PLAN:  1.  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  I discussed with him  further options including medical therapy versus ablation.  Medical therapy would be restricted due to his episode of junctional and ventricular rhythm during waking hours.  Despite this, he does say that he was potentially asleep at the time of his bradycardia.  His EKG today is normal.  I Carman Essick start him on 120 mg of diltiazem.  He would also like ablation.  We Kazden Largo schedule this for when he can be home likely in April.  2.  Junctional/ventricular rhythm: Occurred in the middle of the afternoon.  The patient says that he could have been sleeping.  His EKG today is normal.  No further work-up at this time.    Current medicines are reviewed at length with the patient today.   The patient does not have concerns regarding his medicines.  The following changes were made today:  none  Labs/ tests ordered today include:  No orders of the defined types were placed in this encounter.    Disposition:   FU with Lamari Youngers 3 months  Signed, Adhira Jamil Jorja Loa, MD  07/30/2018 10:36 AM     [facility] [address] [place] [address] [phone] (office) [phone] (fax)

## 2018-07-30 NOTE — Patient Instructions (Signed)
Medication Instructions:  Your physician has recommended you make the following change in your medication: 1. START Diltiazem 120 mg once daily  * If you need a refill on your cardiac medications before your next appointment, please call your pharmacy.   Labwork: None ordered  Testing/Procedures: Your physician has recommended that you have an ablation. Catheter ablation is a medical procedure used to treat some cardiac arrhythmias (irregular heartbeats). During catheter ablation, a long, thin, flexible tube is put into a blood vessel in your groin (upper thigh), or neck. This tube is called an ablation catheter. It is then guided to your heart through the blood vessel. Radio frequency waves destroy small areas of heart tissue where abnormal heartbeats may cause an arrhythmia to start.   Instructions for your ablation: 1. Please [facility] at [facility] of [facility] at 6:30 am on 10/17/2018. 2. Do not eat or drink after midnight the night prior to the procedure.  3. Do not take any medications the morning of the procedure. 4. Both of your groins will need to be shaved for this procedure (if needed). We ask that you do this yourself at home 1-2 days prior to the procedure.  If you are unable/uncomfortable to do yourself, the hospital staff will shave you the day of your procedure (if needed). 5. Plan for an overnight stay in the hospital. 6. You will need someone to drive you home at discharge.   Follow-Up: Your physician recommends that you schedule a follow-up appointment in: between 3/18 - 4/10 with Dr. Elberta Fortis for H&P and pre procedure labs.  Your physician recommends that you schedule a follow-up appointment in: 4 weeks, after your ablation on 10/17/18, with Dr. Elberta Fortis.  *Please note that any paperwork needing to be filled out by the provider will need to be addressed at the front desk prior to seeing the provider. Please note that any FMLA, disability or  other documents regarding health condition is subject to a $25.00 charge that must be received prior to completion of paperwork in the form of a money order or check.  Thank you for choosing [facility]!!   Dory Horn, RN [phone]  Any Other Special Instructions Will Be Listed Below (If Applicable).  Cardiac Ablation Cardiac ablation is a procedure to disable (ablate) a small amount of heart tissue in very specific places. The heart has many electrical connections. Sometimes these connections are abnormal and can cause the heart to beat very fast or irregularly. Ablating some of the problem areas can improve the heart rhythm or return it to normal. Ablation may be done for people who:  Have Wolff-Parkinson-White syndrome.  Have fast heart rhythms (tachycardia).  Have taken medicines for an abnormal heart rhythm (arrhythmia) that were not effective or caused side effects.  Have a high-risk heartbeat that may be life-threatening. During the procedure, a small incision is made in the neck or the groin, and a long, thin, flexible tube (catheter) is inserted into the incision and moved to the heart. Small devices (electrodes) on the tip of the catheter will send out electrical currents. A type of X-ray (fluoroscopy) will be used to help guide the catheter and to provide images of the heart. Tell a health care provider about:  Any allergies you have.  All medicines you are taking, including vitamins, herbs, eye drops, creams, and over-the-counter medicines.  Any problems you or family members have had with anesthetic medicines.  Any blood disorders you have.  Any surgeries you have had.  Any medical conditions you have, such as kidney failure.  Whether you are pregnant or may be pregnant. What are the risks? Generally, this is a safe procedure. However, problems may occur, including:  Infection.  Bruising and bleeding at the catheter insertion site.  Bleeding into the  chest, especially into the sac that surrounds the heart. This is a serious complication.  Stroke or blood clots.  Damage to other structures or organs.  Allergic reaction to medicines or dyes.  Need for a permanent pacemaker if the normal electrical system is damaged. A pacemaker is a small computer that sends electrical signals to the heart and helps your heart beat normally.  The procedure not being fully effective. This may not be recognized until months later. Repeat ablation procedures are sometimes required. What happens before the procedure?  Follow instructions from your health care provider about eating or drinking restrictions.  Ask your health care provider about: ? Changing or stopping your regular medicines. This is especially important if you are taking diabetes medicines or blood thinners. ? Taking medicines such as aspirin and ibuprofen. These medicines can thin your blood. Do not take these medicines before your procedure if your health care provider instructs you not to.  Plan to have someone take you home from the hospital or clinic.  If you will be going home right after the procedure, plan to have someone with you for 24 hours. What happens during the procedure?  To lower your risk of infection: ? Your health care team will wash or sanitize their hands. ? Your skin will be washed with soap. ? Hair may be removed from the incision area.  An IV tube will be inserted into one of your veins.  You will be given a medicine to help you relax (sedative).  The skin on your neck or groin will be numbed.  An incision will be made in your neck or your groin.  A needle will be inserted through the incision and into a large vein in your neck or groin.  A catheter will be inserted into the needle and moved to your heart.  Dye may be injected through the catheter to help your surgeon see the area of the heart that needs treatment.  Electrical currents will be sent from  the catheter to ablate heart tissue in desired areas. There are three types of energy that may be used to ablate heart tissue: ? Heat (radiofrequency energy). ? Laser energy. ? Extreme cold (cryoablation).  When the necessary tissue has been ablated, the catheter will be removed.  Pressure will be held on the catheter insertion area to prevent excessive bleeding.  A bandage (dressing) will be placed over the catheter insertion area. The procedure may vary among health care providers and hospitals. What happens after the procedure?  Your blood pressure, heart rate, breathing rate, and blood oxygen level will be monitored until the medicines you were given have worn off.  Your catheter insertion area will be monitored for bleeding. You will need to lie still for a few hours to ensure that you do not bleed from the catheter insertion area.  Do not drive for 24 hours or as long as directed by your health care provider. Summary  Cardiac ablation is a procedure to disable (ablate) a small amount of heart tissue in very specific places. Ablating some of the problem areas can improve the heart rhythm or return it to normal.  During the procedure,  electrical currents will be sent from the catheter to ablate heart tissue in desired areas. This information is not intended to replace advice given to you by your health care provider. Make sure you discuss any questions you have with your health care provider. Document Released: 11/06/2008 Document Revised: 05/09/2016 Document Reviewed: 05/09/2016 Elsevier Interactive Patient Education  2019 [employer]

## 2018-08-03 ENCOUNTER — Telehealth: Payer: Self-pay | Admitting: Cardiology

## 2018-08-03 MED ORDER — METOPROLOL TARTRATE 25 MG PO TABS: 25.0000 mg | ORAL_TABLET | Freq: Two times a day (BID) | ORAL | 3 refills | Status: DC

## 2018-08-03 NOTE — Telephone Encounter (Signed)
  Patient has had some issues regarding some of the medications he is on. He also needs to speak with the nurse regarding a release to go back to work

## 2018-08-03 NOTE — Telephone Encounter (Signed)
F/U Message         Patient is calling to check status on his work note. Patient states that the medication prescribe by Dr Elberta Fortis kept him out of work. Second call

## 2018-08-03 NOTE — Telephone Encounter (Signed)
Advised pt that we will not write him a work note b/c he did not call the office to discuss SE w/ his new medication as instructed. He reports dizziness and extreme weakness.   Advised to stop Diltiazem and start Lopressor 25 mg BID. Advised to call the office if SE begin after staring the new medication.  Advised if he begins to experience dizziness again to check his BP. Reviewed new medication instructions/precautions w/ pt. Patient verbalized understanding and agreeable to plan.

## 2018-09-20 ENCOUNTER — Encounter: Payer: Self-pay | Admitting: Cardiology

## 2018-09-28 ENCOUNTER — Ambulatory Visit: Payer: BLUE CROSS/BLUE SHIELD | Admitting: Cardiology

## 2018-10-17 ENCOUNTER — Ambulatory Visit (HOSPITAL_COMMUNITY): Admission: RE | Admit: 2018-10-17 | Payer: BLUE CROSS/BLUE SHIELD | Source: Home / Self Care | Admitting: Cardiology

## 2018-10-17 ENCOUNTER — Encounter (HOSPITAL_COMMUNITY): Admission: RE | Payer: Self-pay | Source: Home / Self Care

## 2018-10-17 SURGERY — SVT ABLATION

## 2018-11-13 ENCOUNTER — Ambulatory Visit: Payer: BLUE CROSS/BLUE SHIELD | Admitting: Cardiology

## 2019-02-25 ENCOUNTER — Other Ambulatory Visit: Payer: Self-pay | Admitting: Cardiology

## 2019-02-25 MED ORDER — METOPROLOL TARTRATE 25 MG PO TABS: 25.0000 mg | ORAL_TABLET | Freq: Two times a day (BID) | ORAL | 1 refills | Status: DC

## 2019-02-25 NOTE — Telephone Encounter (Signed)
I spoke to the patient who confirmed that he stopped taking the Diltiazem months ago.  I removed from his medication list.

## 2019-02-25 NOTE — Telephone Encounter (Signed)
Pt's wife calling requesting a refill on pt's medication. Wife did not know the name of the medication. I sent in a refill on Metoprolol 25 mg tablet. I notice on the refill that pt was supposed to stop diltiazem. This medication is still on pt's medication list. Please address

## 2019-05-06 ENCOUNTER — Encounter: Payer: Self-pay | Admitting: *Deleted

## 2019-05-06 ENCOUNTER — Telehealth: Payer: Self-pay | Admitting: *Deleted

## 2019-05-06 NOTE — Telephone Encounter (Signed)
Have attempted to reach pt on several occassions to schedule an OV w/ Dr. Curt Bears to discuss current status and determine if ablation procedure still needed.  Have been unable to reach pt. Mailing letter to home address asking pt to call the office if he would like to discuss further and/or arrange OV.

## 2019-09-24 ENCOUNTER — Telehealth: Payer: Self-pay | Admitting: Cardiology

## 2019-09-24 NOTE — Telephone Encounter (Signed)
Patient called stating he needs a letter releasing him to be able to work.  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

## 2019-09-24 NOTE — Telephone Encounter (Signed)
Left message asking pt to call the office to discuss further.

## 2019-09-25 NOTE — Telephone Encounter (Signed)
Followed up w/ pt. Informed that Dr. Elberta Fortis could not write a letter until he sees pt being that it has been over a year since being seen.   Pt scheduled to follow up next week and appreciates the help with this matter.

## 2019-09-25 NOTE — Telephone Encounter (Signed)
Patient returned your call.

## 2019-10-01 ENCOUNTER — Encounter: Payer: Self-pay | Admitting: Cardiology

## 2019-10-01 ENCOUNTER — Encounter: Payer: Self-pay | Admitting: *Deleted

## 2019-10-01 ENCOUNTER — Other Ambulatory Visit: Payer: Self-pay

## 2019-10-01 ENCOUNTER — Ambulatory Visit (INDEPENDENT_AMBULATORY_CARE_PROVIDER_SITE_OTHER): Payer: Self-pay | Admitting: Cardiology

## 2019-10-01 VITALS — BP 138/66 | HR 67 | Ht 75.0 in | Wt 194.0 lb

## 2019-10-01 DIAGNOSIS — I471 Supraventricular tachycardia: Secondary | ICD-10-CM

## 2019-10-01 MED ORDER — METOPROLOL TARTRATE 25 MG PO TABS: 25.0000 mg | ORAL_TABLET | Freq: Two times a day (BID) | ORAL | 2 refills | Status: DC

## 2019-10-01 NOTE — Patient Instructions (Signed)
Medication Instructions:  Your physician recommends that you continue on your current medications as directed. Please refer to the Current Medication list given to you today.  *If you need a refill on your cardiac medications before your next appointment, please call your pharmacy*   Lab Work: None ordered If you have labs (blood work) drawn today and your tests are completely normal, you will receive your results only by: . MyChart Message (if you have MyChart) OR . A paper copy in the mail If you have any lab test that is abnormal or we need to change your treatment, we will call you to review the results.   Testing/Procedures: None ordered   Follow-Up: At [facility], you and your health needs are our priority.  As part of our continuing mission to provide you with exceptional heart care, we have created designated Provider Care Teams.  These Care Teams include your primary Cardiologist (physician) and Advanced Practice Providers (APPs -  Physician Assistants and Nurse Practitioners) who all work together to provide you with the care you need, when you need it.  We recommend signing up for the patient portal called "MyChart".  Sign up information is provided on this After Visit Summary.  MyChart is used to connect with patients for Virtual Visits (Telemedicine).  Patients are able to view lab/test results, encounter notes, upcoming appointments, etc.  Non-urgent messages can be sent to your provider as well.   To learn more about what you can do with MyChart, go to [email or web address].    Your next appointment:   1 year(s)  The format for your next appointment:   In Person  Provider:   Will Camnitz, MD   Thank you for choosing [facility]!!   Cilicia Borden, RN [phone]    Other Instructions    

## 2019-10-01 NOTE — Progress Notes (Signed)
Electrophysiology Office Note   Date:  10/01/2019   ID:  Ricardo Blair, DOB Mar 10, 1976, MRN [id number]  PCP:  Patient, No Pcp Per  Cardiologist:  Bettina Gavia Primary Electrophysiologist:  Madilynne Mullan Meredith Leeds, MD    No chief complaint on file.    History of Present Illness: Ricardo Blair is a 44 y.o. male who is being seen today for the evaluation of SVT, syncope at the request of No ref. provider found. Presenting today for electrophysiology evaluation.  He was admitted to [facility] 05/30/2018 due to a prolonged episode of unresponsiveness and altered mental status.  He was found to have a normal EEG.  He had an episode of syncope in [place] in which she wore a ZIO monitor.  He has had intermittent episodes of palpitations associated with weakness and one episode of syncope while at work.  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  He was sent to the emergency room at the time.  His cardiac monitor showed multiple SVT episodes at up to 220 bpm.  He has palpitations at least once a week.  Palpitations last for a few minutes, but have lasted hours at a time.  He says that he can sit down and take deep breaths with sometimes terminates his palpitations.  Today, denies symptoms of palpitations, chest pain, shortness of breath, orthopnea, PND, lower extremity edema, claudication, dizziness, presyncope, syncope, bleeding, or neurologic sequela. The patient is tolerating medications without difficulties.  His last being seen he is done well.  He is only had 3 episodes of SVT.  Episodes have lasted only a few minutes at a time.  He was initially on diltiazem but was switched to metoprolol as he was having fatigue on diltiazem.  He is tolerating the metoprolol without issue.  Past Medical History:  Diagnosis Date  . Seizures ([place])    History reviewed. No pertinent surgical history.   Current Outpatient Medications  Medication Sig Dispense Refill  . metoprolol tartrate  (LOPRESSOR) 25 MG tablet Take 1 tablet (25 mg total) by mouth 2 (two) times daily. 180 tablet 2   No current facility-administered medications for this visit.    Allergies:   Patient has no known allergies.   Social History:  The patient  reports that he has been smoking cigarettes. He has quit using smokeless tobacco. He reports current alcohol use. He reports previous drug use. Drug: Marijuana.   Family History:  The patient's Family history is unknown by patient.    ROS:  Please see the history of present illness.   Otherwise, review of systems is positive for none.   All other systems are reviewed and negative.   PHYSICAL EXAM: VS:  BP 138/66   Pulse 67   Ht 6\' 3"  (1.905 m)   Wt 194 lb (88 kg)   SpO2 99%   BMI 24.25 kg/m  , BMI Body mass index is 24.25 kg/m. GEN: Well nourished, well developed, in no acute distress  HEENT: normal  Neck: no JVD, carotid bruits, or masses Cardiac: RRR; no murmurs, rubs, or gallops,no edema  Respiratory:  clear to auscultation bilaterally, normal work of breathing GI: soft, nontender, nondistended, + BS MS: no deformity or atrophy  Skin: warm and dry Neuro:  Strength and sensation are intact Psych: euthymic mood, full affect  EKG:  EKG is ordered today. Personal review of the ekg ordered shows sinus rhythm, rate 67  Recent Labs: No results found for requested labs within last  8760 hours.    Lipid Panel  No results found for: CHOL, TRIG, HDL, CHOLHDL, VLDL, LDLCALC, LDLDIRECT   Wt Readings from Last 3 Encounters:  10/01/19 194 lb (88 kg)  07/30/18 184 lb 6 oz (83.6 kg)  06/28/18 182 lb (82.6 kg)      Other studies Reviewed: Additional studies/ records that were reviewed today include: TTE 04/16/18 Review of the above records today demonstrates:  Mall LV systolic function Mild concentric LVH Mildly to moderately dilated right ventricle Mildly to moderately dilated right atrium Pleural effusion   ASSESSMENT AND PLAN:  1.   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  He was put on initially diltiazem which improved his symptoms but made him quite drowsy.  He is switched to metoprolol which is greatly reduced his SVT burden.  He does not wish to have ablation at this time.  We Marianne Golightly refill his metoprolol 25 mg.  He can go back to work without restriction.    2.  Junctional/ventricular rhythm: Appears to have occurred when he was napping during the day.  No changes.    Current medicines are reviewed at length with the patient today.   The patient does not have concerns regarding his medicines.  The following changes were made today: None  Labs/ tests ordered today include:  Orders Placed This Encounter  Procedures  . EKG 12-Lead     Disposition:   FU with Shaquon Gropp 12 months  Signed, Tearah Saulsbury Jorja Loa, MD  10/01/2019 3:55 PM     [facility] [address] [place] [address] [phone] (office) [phone] (fax)

## 2019-11-16 IMAGING — US US PELVIS LIMITED
1 series · 14 of 16 positions shown · non-contrast
Comparison: None.

CLINICAL DATA: Mass on perineum that drains fluid

EXAM:
LIMITED ULTRASOUND OF PELVIS
TECHNIQUE: Limited transabdominal ultrasound examination of the pelvis was
performed.

[Series 1: us pelvis limited · 0.04mm/px · 14 of 16 slices shown]
[im 1/16]
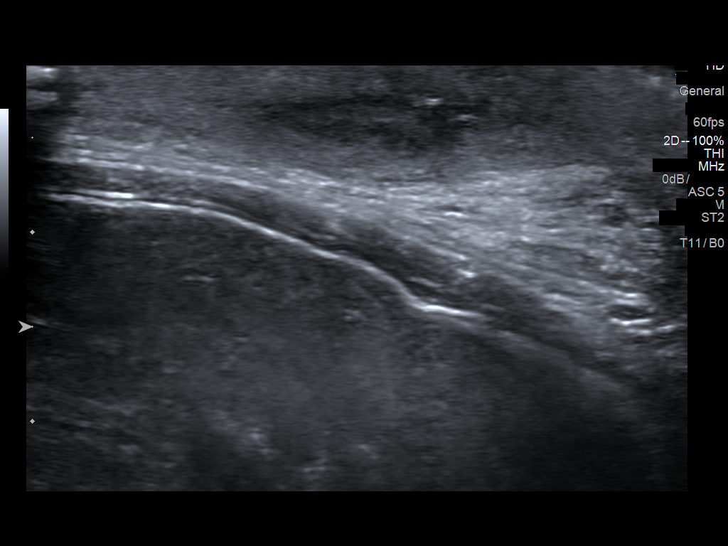
[im 2/16]
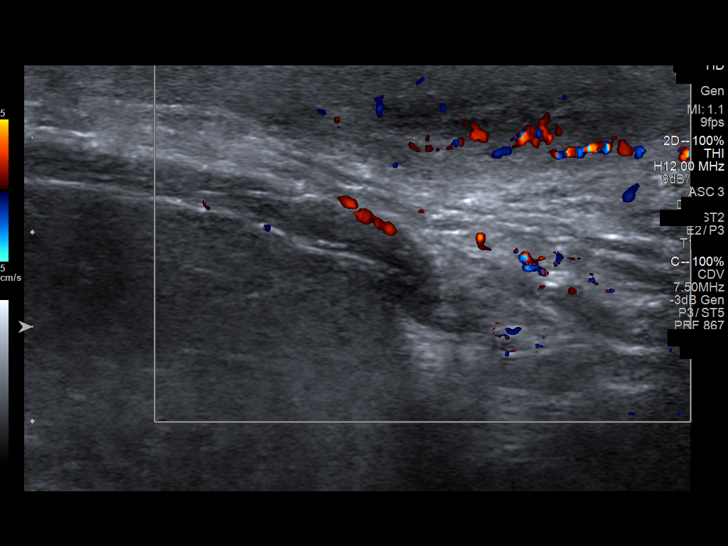
[im 3/16]
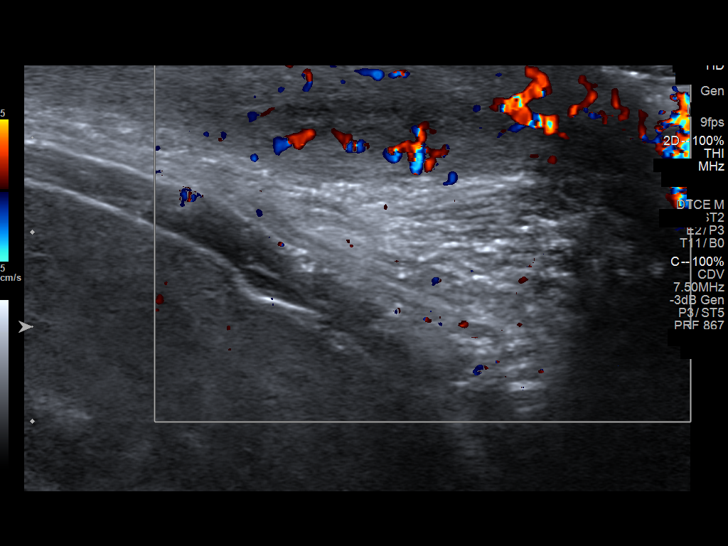
[im 5/16]
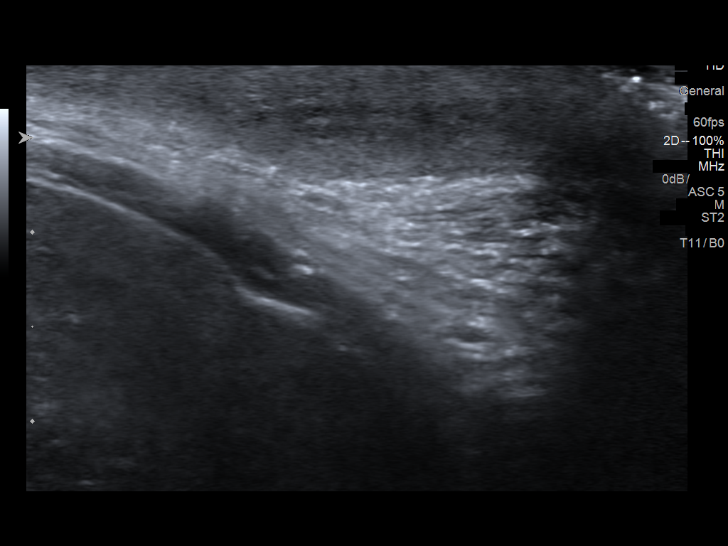
[im 6/16]
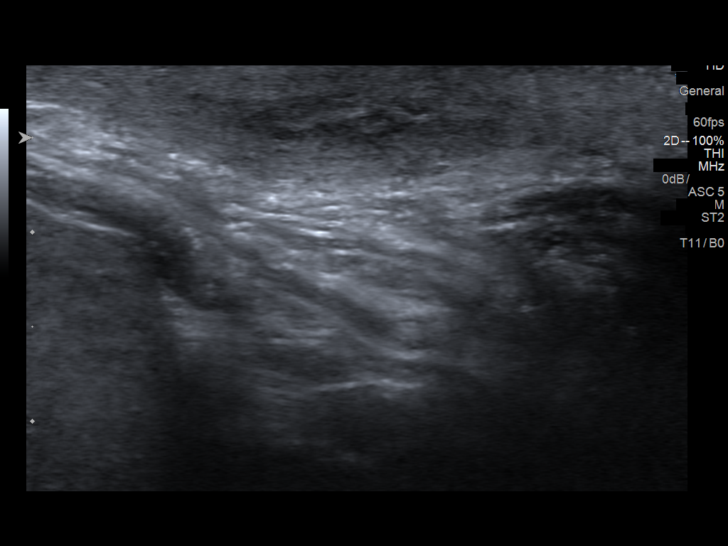
[im 7/16]
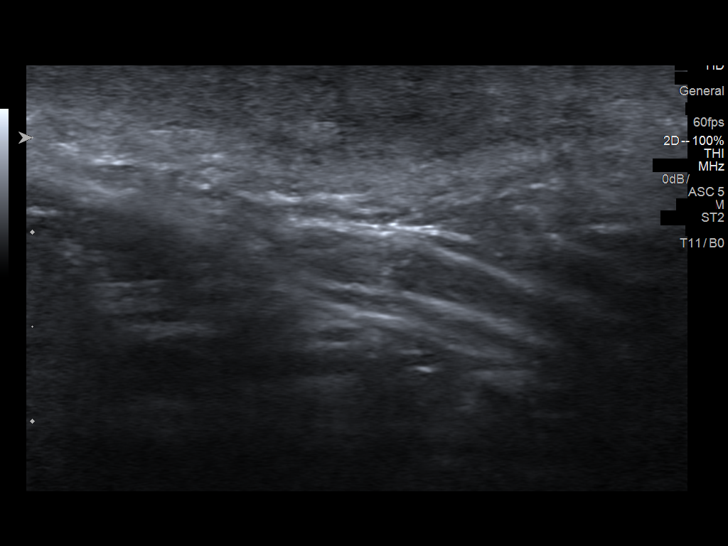
[im 8/16]
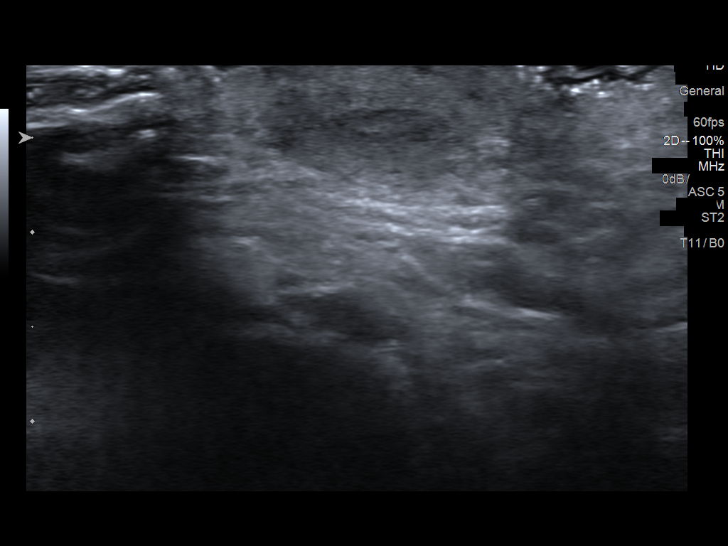
[im 9/16]
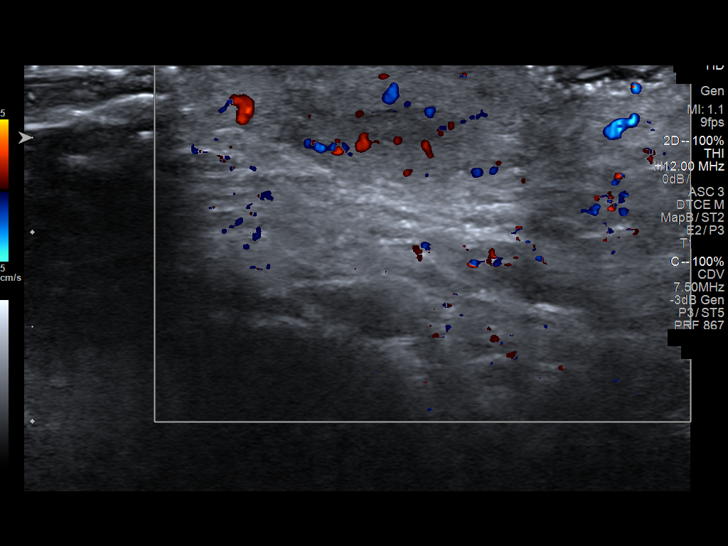
[im 10/16]
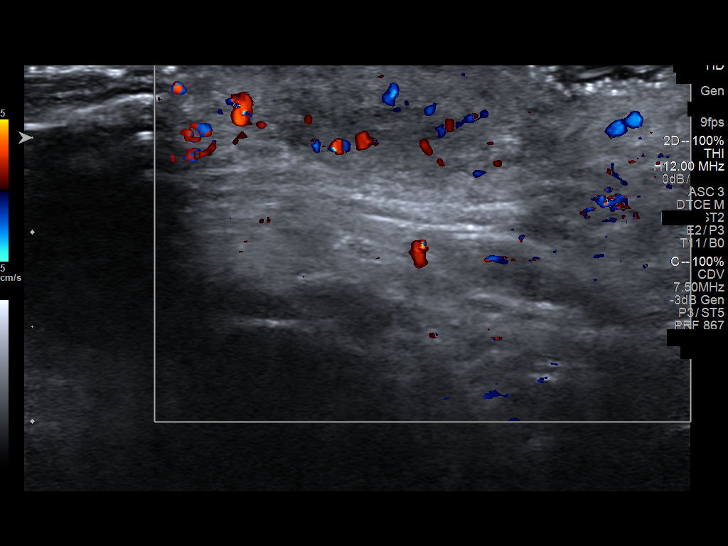
[im 11/16]
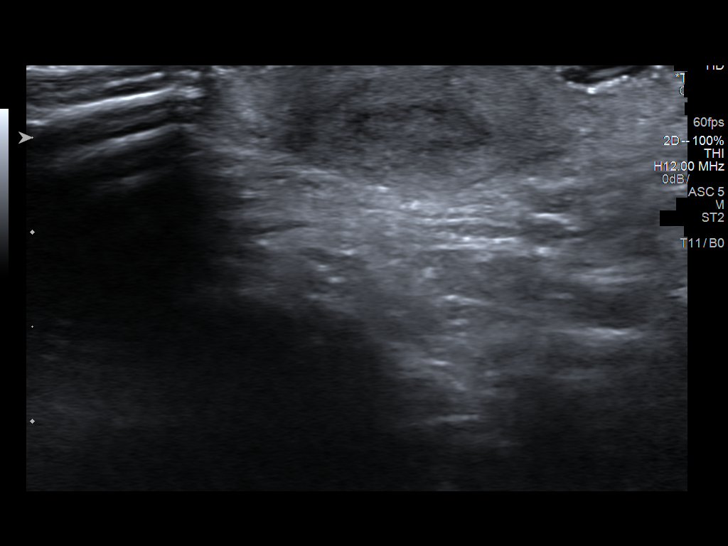
[im 13/16]
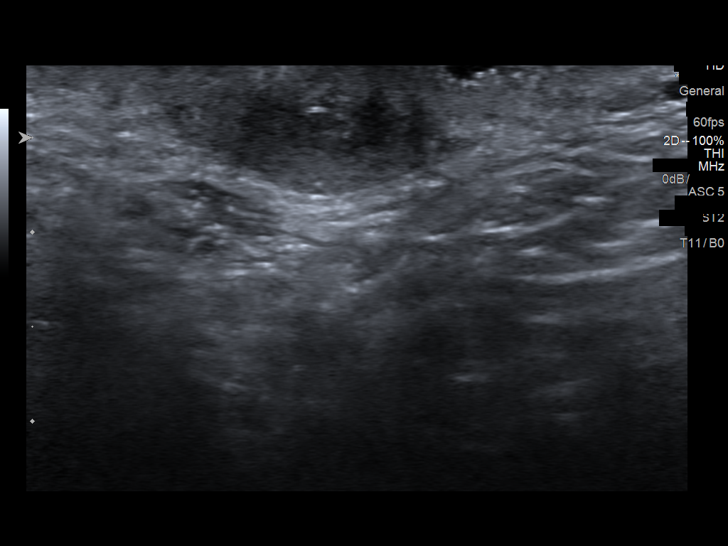
[im 14/16]
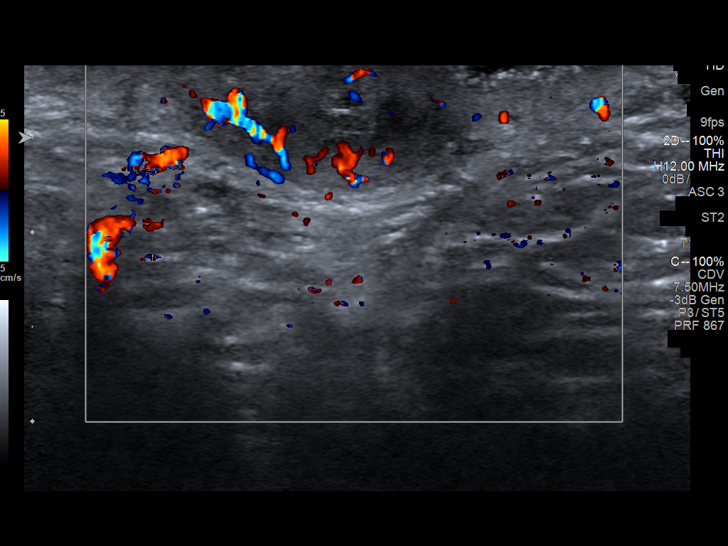
[im 15/16]
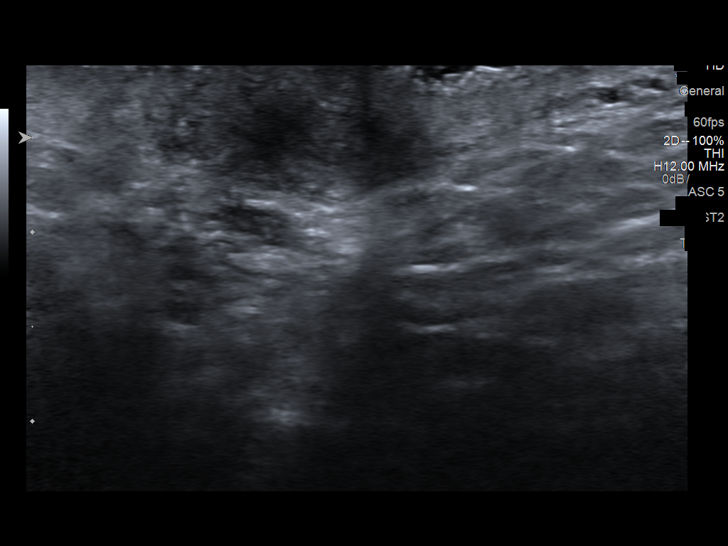
[im 16/16]
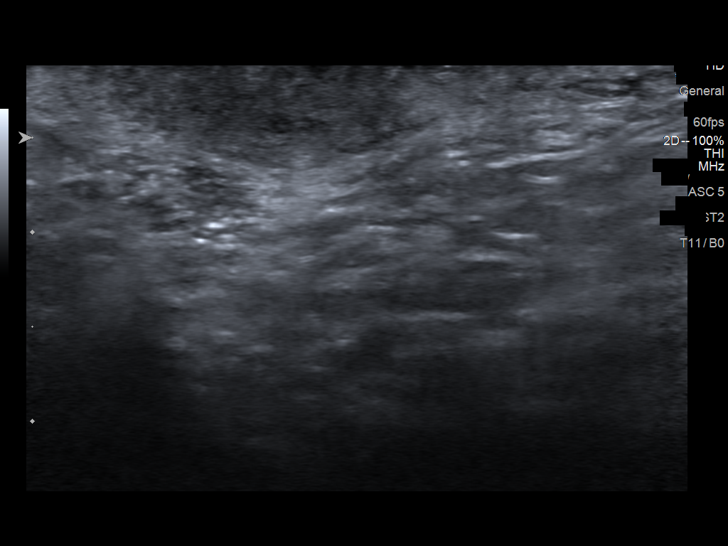

[14 of 16 positions shown; findings below may reference images not displayed]

FINDINGS: Targeted ultrasound in the region of concern. Echogenic edema within
the soft tissues with soft tissue thickening present. Small complex
collection with thickened vascular rim, this measures 1.5 x 0.5 x
1.6 cm.
IMPRESSION: 1.5 x 0.5 x 1.6 cm thick-walled collection within the perineum
corresponding to the region of concern. This could reflect abscess
or inflammatory fluid collection. There is edema and soft tissue
thickening of the surrounding soft tissues.

## 2020-01-15 ENCOUNTER — Other Ambulatory Visit: Payer: Self-pay | Admitting: Cardiology

## 2020-01-15 MED ORDER — METOPROLOL TARTRATE 25 MG PO TABS: 25.0000 mg | ORAL_TABLET | Freq: Two times a day (BID) | ORAL | 2 refills | Status: DC

## 2020-01-15 NOTE — Telephone Encounter (Signed)
Pt's medication was sent to pt's pharmacy as requested. Confirmation received.  °

## 2020-01-15 NOTE — Telephone Encounter (Signed)
Patient is trying to get a refill on his metoprolol tartrate (LOPRESSOR) 25 MG tablet. He is at the [facility] pharmacy in [place]. The pharmacy is telling him that due to the inability to fulfill the prescription, that they think the issue is that Dr. Elberta Fortis has either retired or his prescription license has expired. Please advise.     *STAT* If patient is at the pharmacy, call can be transferred to refill team.   1. Which medications need to be refilled? (please list name of each medication and dose if known)? metoprolol tartrate (LOPRESSOR) 25 MG tablet.  2. Which pharmacy/location (including street and city if local pharmacy) is medication to be sent to?  [facility]  [address] [place], [place] [address] [phone]  3. Do they need a 30 day or 90 day supply? 90 day

## 2020-08-06 ENCOUNTER — Other Ambulatory Visit: Payer: Self-pay

## 2020-08-06 MED ORDER — METOPROLOL TARTRATE 25 MG PO TABS: 25.0000 mg | ORAL_TABLET | Freq: Two times a day (BID) | ORAL | 0 refills | Status: AC

## 2020-08-21 IMAGING — CR DG CHEST 2V
2 series · 2 of 2 positions shown · non-contrast
Comparison: None

CLINICAL DATA: Leukocytosis, altered level of consciousness, found
unresponsive earlier today, history of seizures

EXAM:
CHEST - 2 VIEW

[w chest pa]
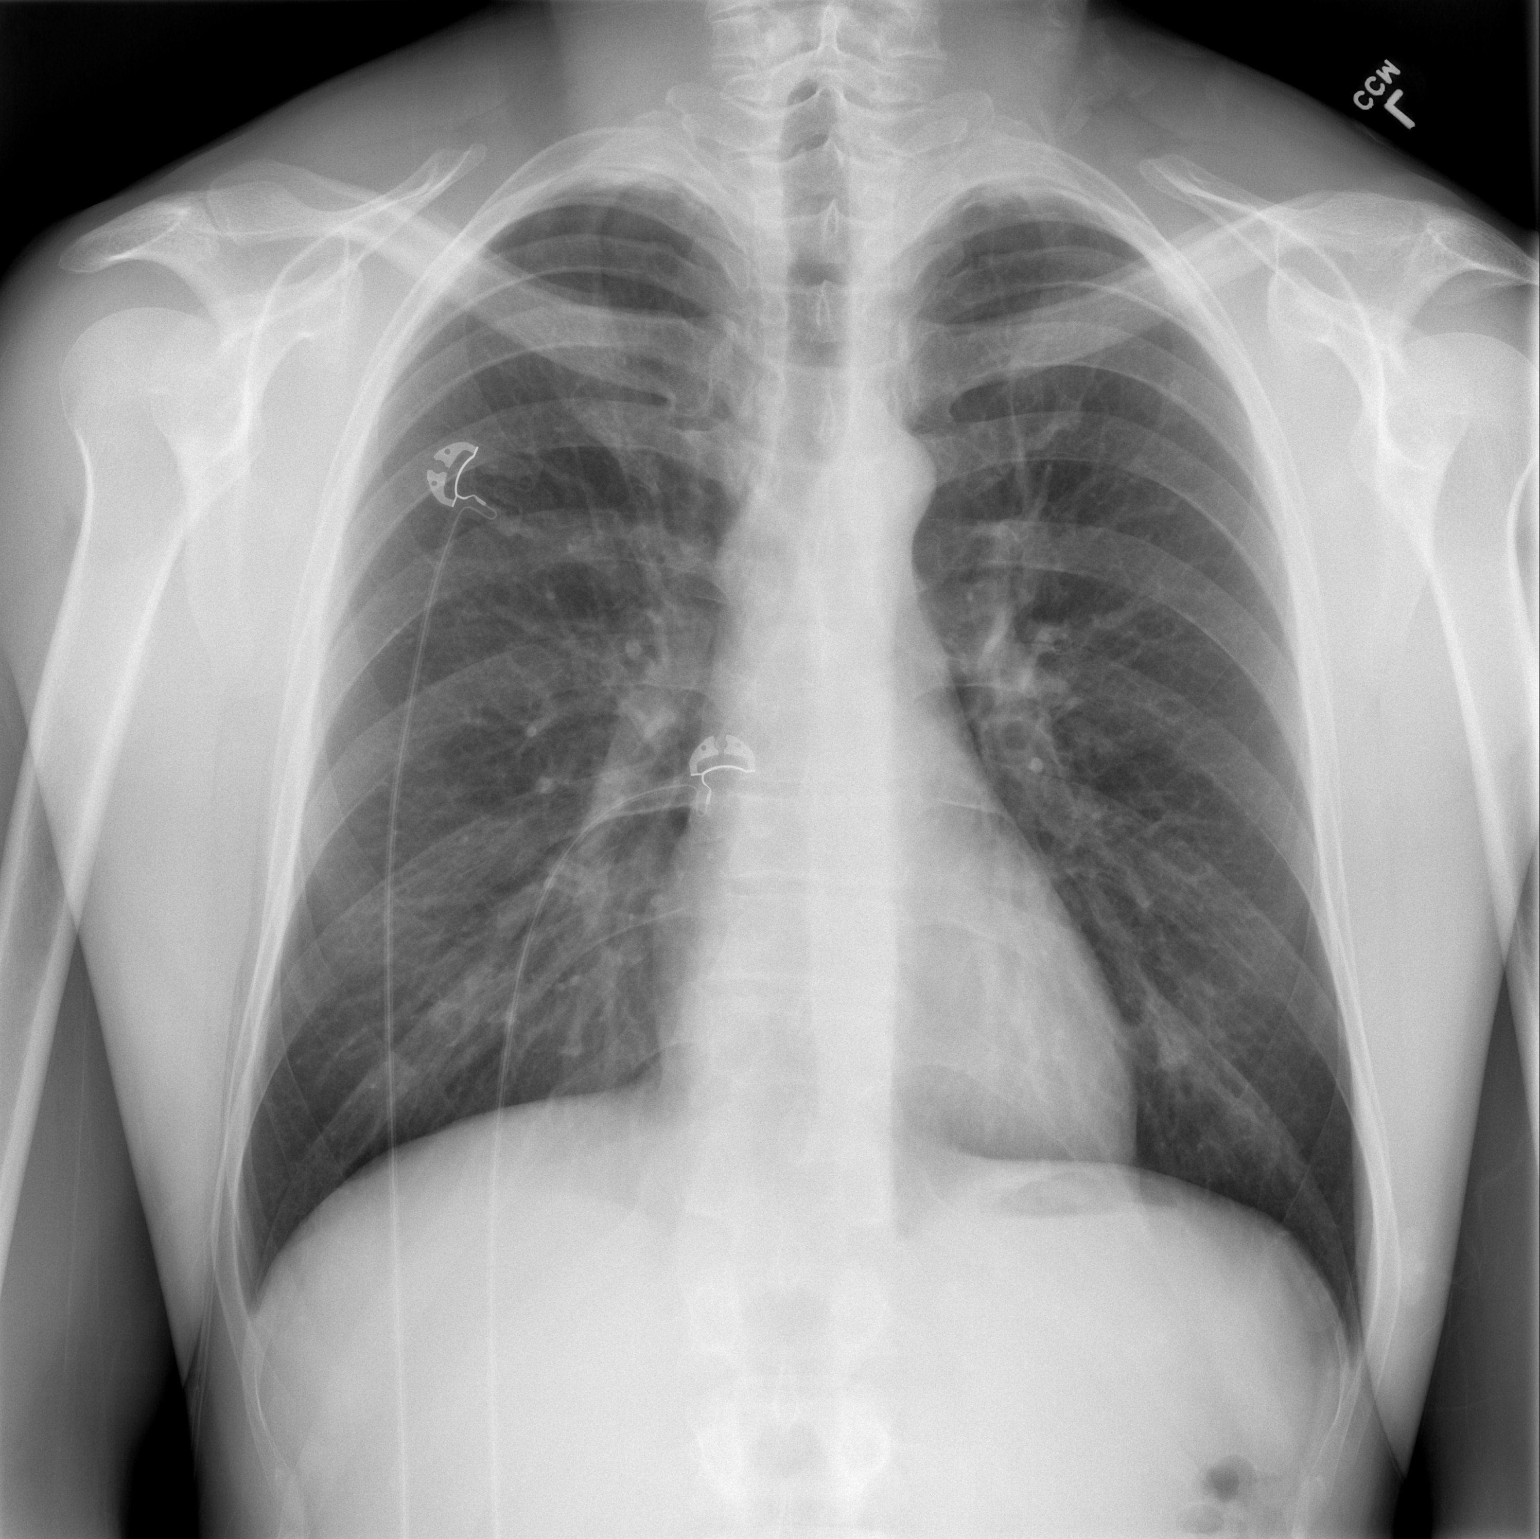

[w chest lat]
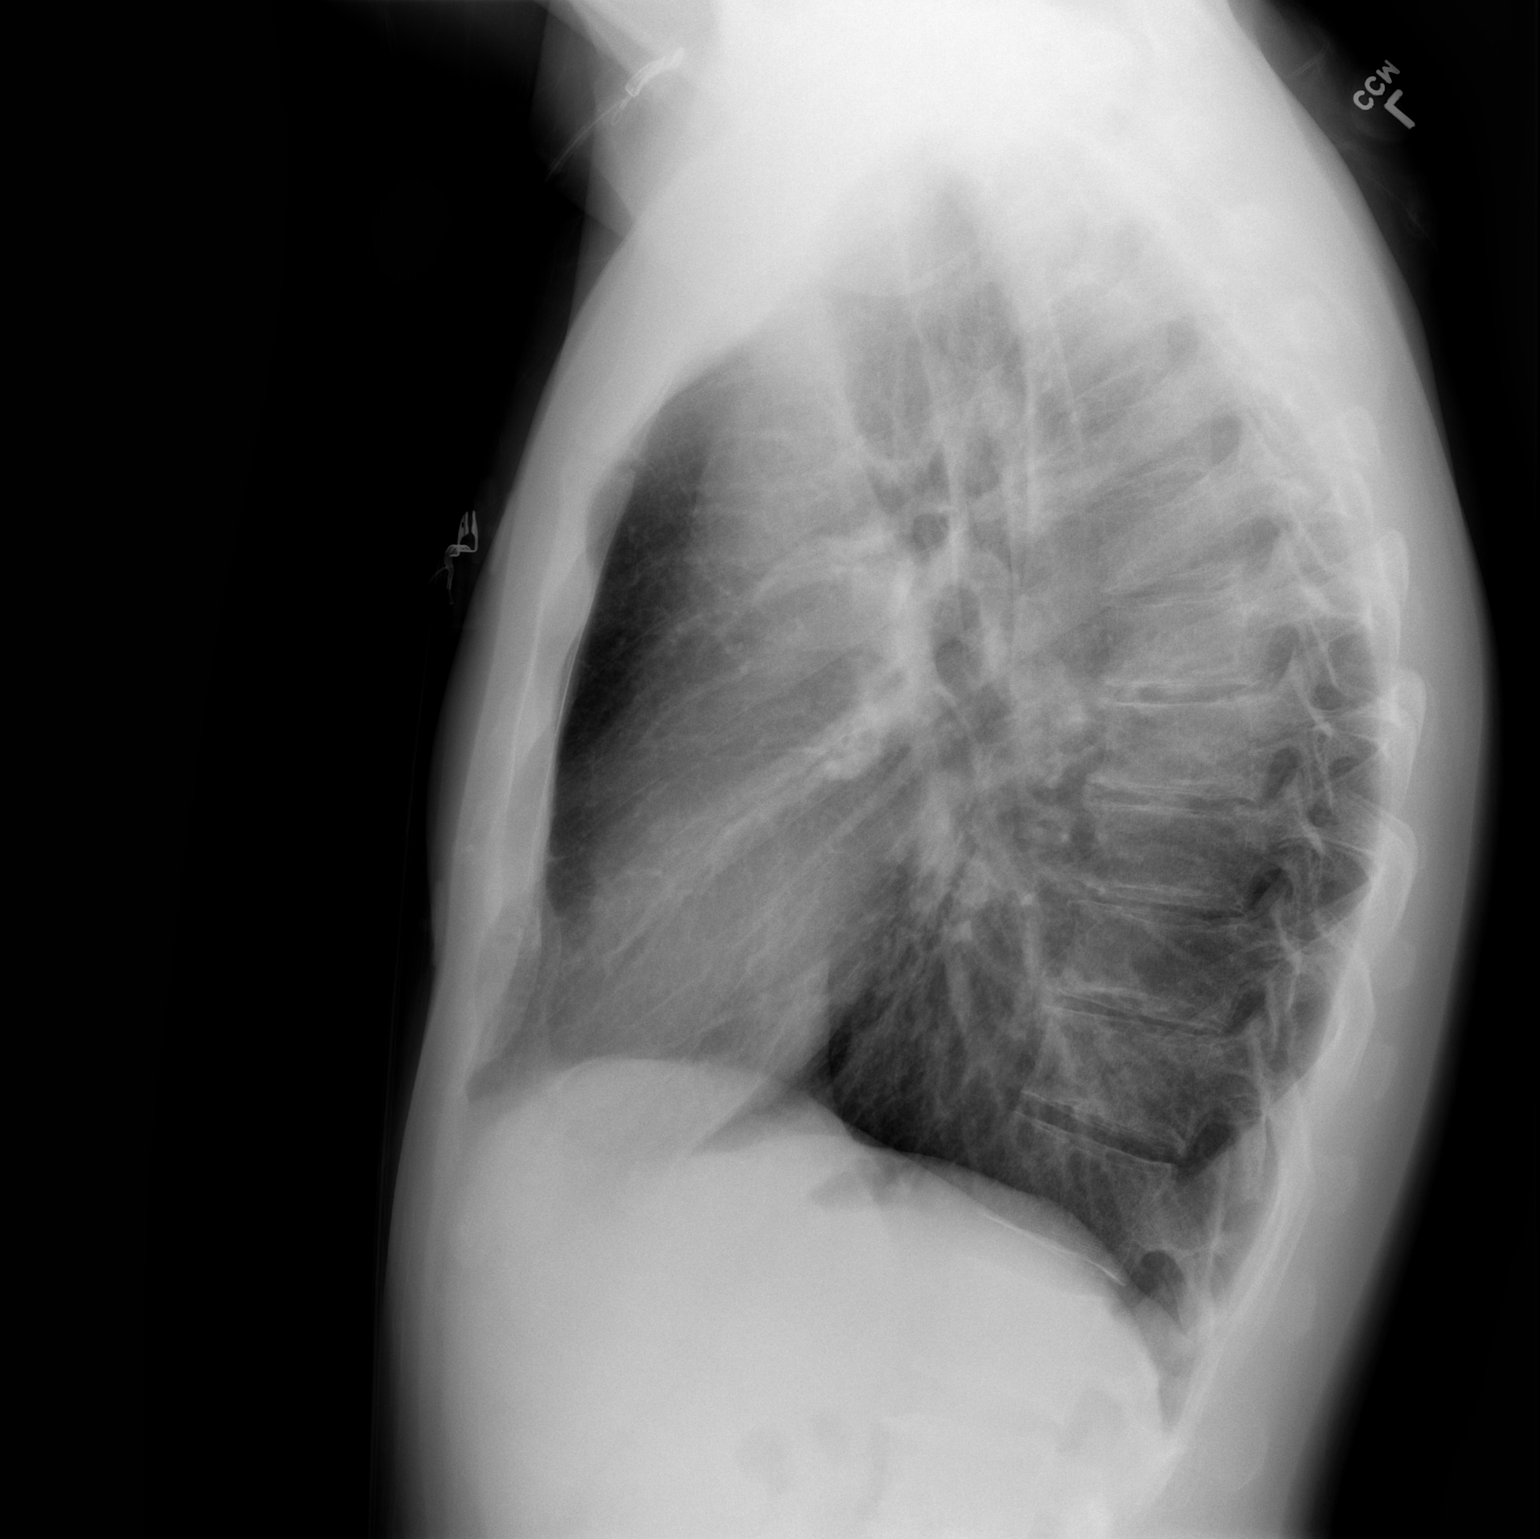

[2 of 2 positions shown; findings below may reference images not displayed]

FINDINGS: Normal heart size, mediastinal contours, and pulmonary vascularity.

Lungs clear.

No pulmonary infiltrate, pleural effusion, or pneumothorax.

Question LEFT nipple shadow.

No acute osseous findings.
IMPRESSION: No acute abnormalities.

Question LEFT nipple shadow; repeat PA chest radiograph with nipple
markers recommended to exclude pulmonary nodule.

## 2020-08-21 IMAGING — DX DG CHEST 1V PORT
1 series · 1 of 1 positions shown · non-contrast
Comparison: Frontal and lateral views earlier this day.

CLINICAL DATA: Altered level of consciousness. Repeat exam with
nipple marker

EXAM:
PORTABLE CHEST 1 VIEW

[chest ap]
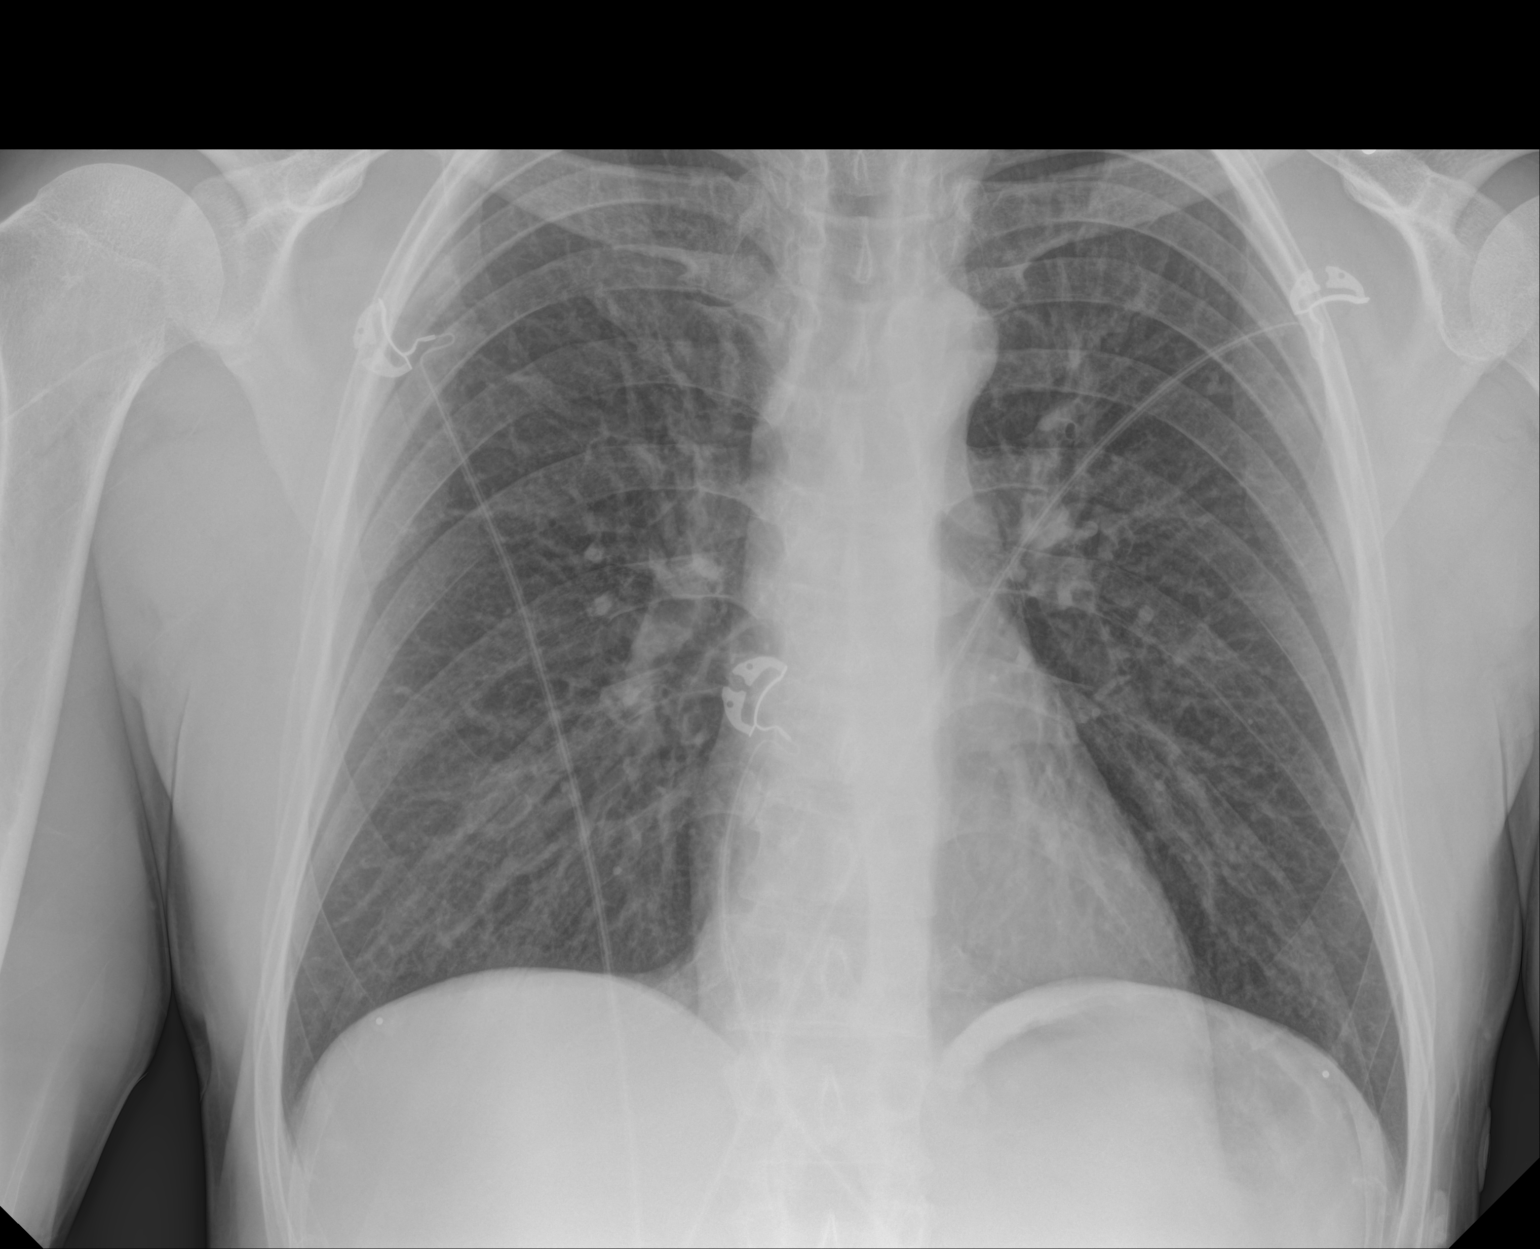

[1 of 1 positions shown; findings below may reference images not displayed]

FINDINGS: AP semi upright view of the chest obtained with nipple markers.
Prior nodular density at the left lung base is not seen. Nipple
markers project over the diaphragms. The lungs are otherwise clear,
no other interval change.
IMPRESSION: Previous nodular density at the left base is no longer seen, likely
represented nipple shadow.

## 2022-11-22 ENCOUNTER — Inpatient Hospital Stay
Admit: 2022-11-22 | Discharge: 2022-11-22 | Disposition: A | Discharge status: Home / Self Care | Payer: PRIVATE HEALTH INSURANCE | Attending: Emergency Medicine

## 2022-11-22 ENCOUNTER — Emergency Department: Admit: 2022-11-22 | Payer: PRIVATE HEALTH INSURANCE

## 2022-11-22 DIAGNOSIS — R42 Dizziness and giddiness: Secondary | ICD-10-CM

## 2022-11-22 LAB — BASIC METABOLIC PANEL
Anion Gap: 11 mmol/L (ref 9–17)
BUN/Creatinine Ratio: 13 (ref 9–20)
BUN: 12 mg/dL (ref 6–20)
CO2: 23 mmol/L (ref 20–31)
Calcium: 8.9 mg/dL (ref 8.6–10.4)
Chloride: 107 mmol/L (ref 98–107)
Creatinine: 0.9 mg/dL (ref 0.7–1.2)
Est, Glom Filt Rate: >90 mL/min/{1.73_m2} (ref >60)
Glucose: 73 mg/dL (ref 70–99)
Potassium: 3.8 mmol/L (ref 3.7–5.3)
Sodium: 141 mmol/L (ref 135–144)

## 2022-11-22 LAB — CBC WITH AUTO DIFFERENTIAL
Basophils %: 1 % (ref 0–2)
Basophils Absolute: 0.08 10*3/uL (ref 0.00–0.20)
Eosinophils %: 2 % (ref 1–4)
Eosinophils Absolute: 0.23 10*3/uL (ref 0.00–0.44)
Hematocrit: 39.4 % — ABNORMAL LOW (ref 40.7–50.3)
Hemoglobin: 12.8 g/dL — ABNORMAL LOW (ref 13.0–17.0)
Immature Granulocytes %: 0 %
Immature Granulocytes Absolute: 0.02 10*3/uL (ref 0.00–0.30)
Lymphocytes %: 33 % (ref 24–43)
Lymphocytes Absolute: 3.19 10*3/uL (ref 1.10–3.70)
MCH: 29.6 pg (ref 25.2–33.5)
MCHC: 32.5 g/dL (ref 28.4–34.8)
MCV: 91.2 fL (ref 82.6–102.9)
MPV: 9.7 fL (ref 8.1–13.5)
Monocytes %: 10 % (ref 3–12)
Monocytes Absolute: 0.97 10*3/uL (ref 0.10–1.20)
NRBC Automated: 0 per 100 WBC
Neutrophils %: 54 % (ref 36–65)
Neutrophils Absolute: 5.17 10*3/uL (ref 1.50–8.10)
Platelets: 244 10*3/uL (ref 138–453)
RBC: 4.32 m/uL (ref 4.21–5.77)
RDW: 13.3 % (ref 11.8–14.4)
WBC: 9.7 10*3/uL (ref 3.5–11.3)

## 2022-11-22 LAB — TROPONIN: Troponin, High Sensitivity: <6 ng/L (ref 0–22)

## 2022-11-22 MED ORDER — SODIUM CHLORIDE 0.9 % IV BOLUS
0.9 | Freq: Once | INTRAVENOUS | Status: AC
Start: 2022-11-22 — End: 2022-11-22
  Administered 2022-11-22: 20:00:00 1000 mL via INTRAVENOUS

## 2022-11-22 NOTE — ED Provider Notes (Signed)
[facility] [facility] ED  eMERGENCY dEPARTMENT eNCOUnter   Attending Attestation     Pt Name: Harry Ali  MRN: [id number]  Birthdate 01/11/1976  Date of evaluation: 11/22/22       Harry Ali is a 47 y.o. male who presents with Dizziness      History:   47 year old male presenting to the ER with an episode of lightheadedness.  The patient does have a recurrent history of SVT.    Exam: Vitals:   Vitals:    11/22/22 1532 11/22/22 1615 11/22/22 1630   BP: 116/65     Pulse: 66 69 57   Resp: 18 12 16    Temp: 98 F (36.7 C)     TempSrc: Oral     SpO2: 100% 100% 100%   Weight: 78 kg (172 lb)     Height: 1.905 m (6\' 3" )       Cardiac workup in the ER did not display positive signs of acute cardiac ischemia.  EKG was reviewed and interpreted by myself, the ED physician.  Patient was asymptomatic in the ER.  The patient was instructed to follow-up with the primary care physician as well as cardiology within 2 days and to return to the ER immediately if symptoms worsen or change.  Patient understands and agrees with the plan.    History: 0  ECG: 0  Patient Age: 62  Risk Factors: 1  Troponin: 0  Heart Score Total: 2        I performed a history and physical examination of the patient and discussed management with the resident. I reviewed the resident's note and agree with the documented findings and plan of care. Any areas of disagreement are noted on the chart. I was personally present for the key portions of any procedures. I have documented in the chart those procedures where I was not present during the key portions. I have personally reviewed all images and agree with the resident's interpretation. I have reviewed the emergency nurses triage note. I agree with the chief complaint, past medical history, past surgical history, allergies, medications, social and family history as documented unless otherwise noted below. Documentation of the HPI, Physical Exam and Medical Decision Making performed by medical students or scribes is  based on my personal performance of the HPI, PE and MDM. I personally evaluated and examined the patient in conjunction with the APC and agree with the assessment, treatment plan, and disposition of the patient as recorded by the APC. Additional findings are as noted.    Harry Leeth, DO  Attending Emergency  Physician              Harry Netherland, DO  11/22/22 1740

## 2022-11-22 NOTE — ED Provider Notes (Signed)
[place] [facility] ED  Emergency Department Encounter  Family Medicine Resident     Pt Name:Harry Ali  MRN: [id number]  Birthdate Mar 23, 1976  Date of evaluation: 11/22/22  PCP:  No primary care provider on file.    CHIEF COMPLAINT       Chief Complaint   Patient presents with    Dizziness     HISTORY OF PRESENT ILLNESS  (Location/Symptom, Timing/Onset, Context/Setting, Quality, Duration, Modifying Factors, Severity.)      Harry Ali is a 47 y.o. male who presents with lightheadedness.    Patient had symptoms of lightheadednesss today morning while getting ready for work. Symptoms lasted approx 10 minutes. No nausea or vomiting. No loc. Patient has history of svt. No chest pain or sob. No palpitations. Patient takes metoprolol 50 BID. Patient denies any other symptoms.    Patient called into work to call off. Work requested release from physician so patient went to urgent care. At urgent care patient had EKG completed and was recommended to go to the ER. Unable to view urgent care records at this time, however, patient did show discharge papers from [facility] urgent care.    Patient is asymptomatic at this time. Patient actually denies sob. Denies chest pain.    Patient does not have any other acute concerns at this time.    PAST MEDICAL / SURGICAL / SOCIAL / FAMILY HISTORY      has a past medical history of Depression, Hypertension, and SVT (supraventricular tachycardia) (HCC).     has a past surgical history that includes Appendectomy.    Social History     Socioeconomic History    Marital status: Single     Spouse name: Not on file    Number of children: Not on file    Years of education: Not on file    Highest education level: Not on file   Occupational History    Not on file   Tobacco Use    Smoking status: Every Day     Current packs/day: 1.00     Types: Cigarettes    Smokeless tobacco: Never   Vaping Use    Vaping Use: Never used   Substance and Sexual Activity    Alcohol use: Not Currently    Drug use: Yes      Types: Marijuana Sheran Fava)    Sexual activity: Not on file   Other Topics Concern    Not on file   Social History Narrative    Not on file     Social Determinants of Health     Financial Resource Strain: Not on file   Food Insecurity: Not on file   Transportation Needs: Not on file   Physical Activity: Not on file   Stress: Not on file   Social Connections: Not on file   Intimate Partner Violence: Not on file   Housing Stability: Not on file       History reviewed. No pertinent family history.    Allergies:  Bee venom    Home Medications:  Prior to Admission medications    Medication Sig Start Date End Date Taking? Authorizing Provider   metoprolol tartrate (LOPRESSOR) 25 MG tablet Take 1 tablet by mouth 2 times daily   Yes [provider]   DULOXETINE HCL PO Take by mouth daily   Yes [provider]       REVIEW OF SYSTEMS    (2-9 systems for level 4, 10 or more for level 5)  Review of Systems   Constitutional:  Negative for activity change, appetite change and fever.   HENT:  Negative for congestion and sore throat.    Eyes:  Negative for pain and visual disturbance.   Respiratory:  Negative for cough, shortness of breath and wheezing.    Cardiovascular:  Negative for chest pain and leg swelling.   Gastrointestinal:  Negative for abdominal pain, constipation, diarrhea, nausea and vomiting.   Genitourinary:  Negative for difficulty urinating and dysuria.   Neurological:  Positive for light-headedness. Negative for syncope, weakness and headaches.     PHYSICAL EXAM   (up to 7 for level 4, 8 or more for level 5)      INITIAL VITALS:   BP 116/65   Pulse 57   Temp 98 F (36.7 C) (Oral)   Resp 16   Ht 1.905 m (6\' 3" )   Wt 78 kg (172 lb)   SpO2 100%   BMI 21.50 kg/m     Physical Exam  Vitals and nursing note reviewed.   Constitutional:       General: He is not in acute distress.     Appearance: Normal appearance. He is not ill-appearing.   Cardiovascular:      Rate and Rhythm: Normal rate  and regular rhythm.      Heart sounds: No murmur heard.  Pulmonary:      Effort: No respiratory distress.      Breath sounds: Normal breath sounds. No wheezing or rhonchi.   Abdominal:      Palpations: Abdomen is soft.      Tenderness: There is no abdominal tenderness. There is no guarding or rebound.   Musculoskeletal:         General: No tenderness.      Right lower leg: No edema.      Left lower leg: No edema.   Neurological:      Mental Status: He is alert.       DIFFERENTIAL  DIAGNOSIS     PLAN (LABS / IMAGING / EKG):  Orders Placed This Encounter   Procedures    XR CHEST PORTABLE    Troponin    Basic Metabolic Panel    CBC with Auto Differential    EKG 12 Lead     MEDICATIONS ORDERED:  Orders Placed This Encounter   Medications    sodium chloride 0.9 % bolus 1,000 mL     DDX: ACS, dehydration, unspecified lightheadedness, SVT    DIAGNOSTIC RESULTS / EMERGENCY DEPARTMENT COURSE / MDM     Results for orders placed or performed during the hospital encounter of 11/22/22   Troponin   Result Value Ref Range    Troponin, High Sensitivity <6 0 - 22 ng/L   Basic Metabolic Panel   Result Value Ref Range    Sodium 141 135 - 144 mmol/L    Potassium 3.8 3.7 - 5.3 mmol/L    Chloride 107 98 - 107 mmol/L    CO2 23 20 - 31 mmol/L    Anion Gap 11 9 - 17 mmol/L    Glucose 73 70 - 99 mg/dL    BUN 12 6 - 20 mg/dL    Creatinine 0.9 0.7 - 1.2 mg/dL    Est, Glom Filt Rate >90 >60 mL/min/1.43m2    BUN/Creatinine Ratio 13 9 - 20    Calcium 8.9 8.6 - 10.4 mg/dL   CBC with Auto Differential   Result Value Ref Range    WBC 9.7 3.5 -  11.3 k/uL    RBC 4.32 4.21 - 5.77 m/uL    Hemoglobin 12.8 (L) 13.0 - 17.0 g/dL    Hematocrit 16.1 (L) 40.7 - 50.3 %    MCV 91.2 82.6 - 102.9 fL    MCH 29.6 25.2 - 33.5 pg    MCHC 32.5 28.4 - 34.8 g/dL    RDW 09.6 04.5 - 40.9 %    Platelets 244 138 - 453 k/uL    MPV 9.7 8.1 - 13.5 fL    NRBC Automated 0.0 0.0 per 100 WBC    Neutrophils % 54 36 - 65 %    Lymphocytes % 33 24 - 43 %    Monocytes % 10 3 - 12 %     Eosinophils % 2 1 - 4 %    Basophils % 1 0 - 2 %    Immature Granulocytes % 0 0 %    Neutrophils Absolute 5.17 1.50 - 8.10 k/uL    Lymphocytes Absolute 3.19 1.10 - 3.70 k/uL    Monocytes Absolute 0.97 0.10 - 1.20 k/uL    Eosinophils Absolute 0.23 0.00 - 0.44 k/uL    Basophils Absolute 0.08 0.00 - 0.20 k/uL    Immature Granulocytes Absolute 0.02 0.00 - 0.30 k/uL       IMPRESSION: lightheadedness    RADIOLOGY:    XR CHEST PORTABLE    Result Date: 11/22/2022  No acute process.      EKG    Completed. No SVT at this time. No ST changes. EKG unremarkable.    All EKG's are interpreted by the Emergency Department Physician who either signs or Co-signs this chart in the absence of a cardiologist.    EMERGENCY DEPARTMENT COURSE:    Patient given bolus of fluid. Blood work and imaging unremarkable. Patient given discharge instructions including establishing care with pcp, following up with cardio, and returning to ED as needed    PROCEDURES:  None    CONSULTS:  None    CRITICAL CARE:  None    FINAL IMPRESSION      1. Lightheadedness          DISPOSITION / PLAN     DISPOSITION Decision To Discharge 11/22/2022 05:35:53 PM      PATIENT REFERRED TO:  [facility] ED  [address] [place] [address]  [phone]  Go to   As needed, If symptoms worsen    Thane Edu, MD  [address]  [place] [address]  [phone]    Schedule an appointment as soon as possible for a visit   For history of SVT    [facility]  [address]  [place] [address]  Schedule an appointment as soon as possible for a visit in 2 days  Establish care with PCP      DISCHARGE MEDICATIONS:  New Prescriptions    No medications on file       Nyoka Cowden, MD  Family Medicine Resident    (Please note that portions of thisnote were completed with a voice recognition program.  Efforts were made to edit the dictations but occasionally words are mis-transcribed.)

## 2022-11-22 NOTE — ED Notes (Signed)
Pt to ed c/o dizziness x 1 year. Pt states he called off work today and work requires a Chiropractor note. Pt a/o x4. Respirations equal and non labored. Pt speaking in full and complete sentences. Pt placed on cardiac monitor. Bed locked and in lowest position. Call light in reach.

## 2022-11-24 LAB — EKG 12-LEAD
Atrial Rate: 67 {beats}/min
P Axis: 73 degrees
P-R Interval: 154 ms
Q-T Interval: 390 ms
QRS Duration: 100 ms
QTc Calculation (Bazett): 412 ms
R Axis: 56 degrees
T Axis: 57 degrees
Ventricular Rate: 67 {beats}/min
